# Patient Record
Sex: Female | Born: 1990 | Race: White | Hispanic: No | Marital: Single | Smoking: Never smoker
Health system: Southern US, Community
[De-identification: ages and names within clinical notes are randomized; demographics above are authoritative.]

## PROBLEM LIST (undated history)

## (undated) DIAGNOSIS — J45909 Unspecified asthma, uncomplicated: Secondary | ICD-10-CM

## (undated) DIAGNOSIS — L732 Hidradenitis suppurativa: Secondary | ICD-10-CM

## (undated) DIAGNOSIS — D649 Anemia, unspecified: Secondary | ICD-10-CM

## (undated) DIAGNOSIS — Z8719 Personal history of other diseases of the digestive system: Secondary | ICD-10-CM

## (undated) DIAGNOSIS — J3089 Other allergic rhinitis: Secondary | ICD-10-CM

## (undated) HISTORY — PX: TONSILLECTOMY: SUR1361

---

## 2014-10-01 DIAGNOSIS — L732 Hidradenitis suppurativa: Secondary | ICD-10-CM | POA: Insufficient documentation

## 2014-10-01 DIAGNOSIS — L700 Acne vulgaris: Secondary | ICD-10-CM | POA: Insufficient documentation

## 2016-01-17 ENCOUNTER — Encounter: Payer: Self-pay | Admitting: *Deleted

## 2016-01-17 ENCOUNTER — Emergency Department
Admission: EM | Admit: 2016-01-17 | Discharge: 2016-01-17 | Disposition: A | Discharge status: Home / Self Care | Payer: Self-pay | Attending: Student | Admitting: Student

## 2016-01-17 ENCOUNTER — Emergency Department: Payer: Self-pay

## 2016-01-17 DIAGNOSIS — R1013 Epigastric pain: Secondary | ICD-10-CM

## 2016-01-17 DIAGNOSIS — R55 Syncope and collapse: Secondary | ICD-10-CM | POA: Insufficient documentation

## 2016-01-17 DIAGNOSIS — R079 Chest pain, unspecified: Secondary | ICD-10-CM | POA: Insufficient documentation

## 2016-01-17 DIAGNOSIS — Z8719 Personal history of other diseases of the digestive system: Secondary | ICD-10-CM

## 2016-01-17 DIAGNOSIS — R52 Pain, unspecified: Secondary | ICD-10-CM

## 2016-01-17 DIAGNOSIS — R112 Nausea with vomiting, unspecified: Secondary | ICD-10-CM

## 2016-01-17 DIAGNOSIS — K8020 Calculus of gallbladder without cholecystitis without obstruction: Secondary | ICD-10-CM | POA: Insufficient documentation

## 2016-01-17 DIAGNOSIS — J45909 Unspecified asthma, uncomplicated: Secondary | ICD-10-CM | POA: Insufficient documentation

## 2016-01-17 HISTORY — DX: Personal history of other diseases of the digestive system: Z87.19

## 2016-01-17 HISTORY — DX: Unspecified asthma, uncomplicated: J45.909

## 2016-01-17 HISTORY — DX: Hidradenitis suppurativa: L73.2

## 2016-01-17 LAB — CBC WITH DIFFERENTIAL/PLATELET
BASOS ABS: 0 10*3/uL (ref 0–0.1)
Basophils Relative: 0 %
EOS PCT: 2 %
Eosinophils Absolute: 0.1 10*3/uL (ref 0–0.7)
HEMATOCRIT: 38.6 % (ref 35.0–47.0)
HEMOGLOBIN: 12.6 g/dL (ref 12.0–16.0)
LYMPHS ABS: 2.8 10*3/uL (ref 1.0–3.6)
Lymphocytes Relative: 34 %
MCH: 25.3 pg — AB (ref 26.0–34.0)
MCHC: 32.6 g/dL (ref 32.0–36.0)
MCV: 77.7 fL — AB (ref 80.0–100.0)
Monocytes Absolute: 0.7 10*3/uL (ref 0.2–0.9)
Monocytes Relative: 9 %
NEUTROS ABS: 4.7 10*3/uL (ref 1.4–6.5)
NEUTROS PCT: 55 %
Platelets: 275 10*3/uL (ref 150–440)
RBC: 4.97 MIL/uL (ref 3.80–5.20)
RDW: 16.2 % — ABNORMAL HIGH (ref 11.5–14.5)
WBC: 8.5 10*3/uL (ref 3.6–11.0)

## 2016-01-17 LAB — COMPREHENSIVE METABOLIC PANEL
ALK PHOS: 53 U/L (ref 38–126)
ALT: 27 U/L (ref 14–54)
AST: 19 U/L (ref 15–41)
Albumin: 3.8 g/dL (ref 3.5–5.0)
Anion gap: 7 (ref 5–15)
BUN: 11 mg/dL (ref 6–20)
CALCIUM: 8.8 mg/dL — AB (ref 8.9–10.3)
CO2: 24 mmol/L (ref 22–32)
CREATININE: 0.7 mg/dL (ref 0.44–1.00)
Chloride: 106 mmol/L (ref 101–111)
Glucose, Bld: 109 mg/dL — ABNORMAL HIGH (ref 65–99)
Potassium: 3.9 mmol/L (ref 3.5–5.1)
Sodium: 137 mmol/L (ref 135–145)
Total Bilirubin: 0.2 mg/dL — ABNORMAL LOW (ref 0.3–1.2)
Total Protein: 7.4 g/dL (ref 6.5–8.1)

## 2016-01-17 LAB — LIPASE, BLOOD: LIPASE: 24 U/L (ref 11–51)

## 2016-01-17 LAB — TROPONIN I

## 2016-01-17 LAB — FIBRIN DERIVATIVES D-DIMER (ARMC ONLY): Fibrin derivatives D-dimer (ARMC): 523 — ABNORMAL HIGH (ref 0–499)

## 2016-01-17 LAB — POCT PREGNANCY, URINE: Preg Test, Ur: NEGATIVE

## 2016-01-17 MED ORDER — ONDANSETRON 4 MG PO TBDP: 4.0000 mg | ORAL_TABLET | Freq: Three times a day (TID) | ORAL | 0 refills | Status: DC | PRN

## 2016-01-17 MED ORDER — OXYCODONE HCL 5 MG PO TABS: 5.0000 mg | ORAL_TABLET | Freq: Four times a day (QID) | ORAL | 0 refills | Status: DC | PRN

## 2016-01-17 MED ORDER — MORPHINE SULFATE (PF) 4 MG/ML IV SOLN
INTRAVENOUS | Status: AC
  Administered 2016-01-17: 4 mg via INTRAVENOUS
  Filled 2016-01-17: qty 1

## 2016-01-17 MED ORDER — MORPHINE SULFATE (PF) 4 MG/ML IV SOLN
4.0000 mg | Freq: Once | INTRAVENOUS | Status: AC
  Administered 2016-01-17: 4 mg via INTRAVENOUS

## 2016-01-17 MED ORDER — ONDANSETRON HCL 4 MG/2ML IJ SOLN
4.0000 mg | Freq: Once | INTRAMUSCULAR | Status: AC
  Administered 2016-01-17: 4 mg via INTRAVENOUS
  Filled 2016-01-17: qty 2

## 2016-01-17 MED ORDER — IOPAMIDOL (ISOVUE-370) INJECTION 76%
100.0000 mL | Freq: Once | INTRAVENOUS | Status: AC | PRN
  Administered 2016-01-17: 100 mL via INTRAVENOUS

## 2016-01-17 MED ORDER — SODIUM CHLORIDE 0.9 % IV BOLUS (SEPSIS)
1000.0000 mL | Freq: Once | INTRAVENOUS | Status: AC
  Administered 2016-01-17: 1000 mL via INTRAVENOUS

## 2016-01-17 MED ORDER — MORPHINE SULFATE (PF) 4 MG/ML IV SOLN
4.0000 mg | Freq: Once | INTRAVENOUS | Status: AC
  Administered 2016-01-17: 4 mg via INTRAVENOUS
  Filled 2016-01-17: qty 1

## 2016-01-17 NOTE — ED Provider Notes (Signed)
[facility] Emergency Department Provider Note   ____________________________________________   First MD Initiated Contact with Patient 01/17/16 [phone]     (approximate)  I have reviewed the triage vital signs and the nursing notes.   HISTORY  Chief Complaint Abdominal Pain    HPI Diana Aguirre is a 25 y.o. female with history of hidradenitis suppurativa and asthma who presents for evaluation of severe epigastric pain radiating to her back which awoke her from sleep earlier this morning, sudden onset, constant, no modifying factors are she is also had several episodes of nonbloody nonbilious emesis. She has never had anything like this before. She denies any chest pain or difficulty breathing. She reports the pain in her abdomen is severe and radiates to her back. No pain or burning with urination. She recently fractured her left foot and is in a walking boot. She does take oral contraceptives continuously and has not had a menatrual period since June. In the waiting room she became diaphoretic, was complaining of pain, had lightheadedness and a near syncopal episode where she did not fall or injure herself.   Past Medical History:  Diagnosis Date  . Asthma   . Hidradenitis     There are no active problems to display for this patient.   History reviewed. No pertinent surgical history.  Prior to Admission medications   Not on File    Allergies Latex and Sulfa antibiotics  History reviewed. No pertinent family history.  Social History Social History  Substance Use Topics  . Smoking status: Never Smoker  . Smokeless tobacco: Never Used  . Alcohol use Not on file    Review of Systems Constitutional: No fever/chills Eyes: No visual changes. ENT: No sore throat. Cardiovascular: Denies chest pain. Respiratory: Denies shortness of breath. Gastrointestinal: + abdominal pain.  + nausea, + vomiting.  No diarrhea.  No constipation. Genitourinary:  Negative for dysuria. Musculoskeletal: Negative for back pain. Skin: Negative for rash. Neurological: Negative for headaches, focal weakness or numbness.  10-point ROS otherwise negative.  ____________________________________________   PHYSICAL EXAM:  VITAL SIGNS: ED Triage Vitals [01/17/16 0707]  Enc Vitals Group     BP (!) 156/96     Pulse Rate (!) 107     Resp 18     Temp 98.6 F (37 C)     Temp Source Oral     SpO2 98 %     Weight 275 lb (124.7 kg)     Height 5\' 7"  (1.702 m)     Head Circumference      Peak Flow      Pain Score 9     Pain Loc      Pain Edu?      Excl. in GC?     Constitutional: Alert and oriented. Nontoxic-appearing, in mild distress due to pain. Eyes: Conjunctivae are normal. PERRL. EOMI. Head: Atraumatic. Nose: No congestion/rhinnorhea. Mouth/Throat: Mucous membranes are moist.  Oropharynx non-erythematous. Neck: No stridor.  Supple without meningismus. Cardiovascular: Mildly tachycardic rate, regular rhythm. Grossly normal heart sounds.  Good peripheral circulation. Respiratory: Normal respiratory effort.  No retractions. Lungs CTAB. Gastrointestinal: Soft with tenderness to palpation in the epigastrium and the right upper quadrant. No CVA tenderness. Genitourinary: Deferred Musculoskeletal: Left foot in the walking boot, brisk capillary refill. Neurologic:  Normal speech and language. No gross focal neurologic deficits are appreciated. No gait instability. Skin:  Skin is warm, dry and intact. No rash noted. Psychiatric: Mood and affect are normal. Speech and behavior  are normal.  ____________________________________________   LABS (all labs ordered are listed, but only abnormal results are displayed)  Labs Reviewed  CBC WITH DIFFERENTIAL/PLATELET - Abnormal; Notable for the following:       Result Value   MCV 77.7 (*)    MCH 25.3 (*)    RDW 16.2 (*)    All other components within normal limits  COMPREHENSIVE METABOLIC PANEL -  Abnormal; Notable for the following:    Glucose, Bld 109 (*)    Calcium 8.8 (*)    Total Bilirubin 0.2 (*)    All other components within normal limits  FIBRIN DERIVATIVES D-DIMER ([facility] ONLY) - Abnormal; Notable for the following:    Fibrin derivatives D-dimer ([facility]) 523 (*)    All other components within normal limits  LIPASE, BLOOD  TROPONIN I  POCT PREGNANCY, URINE   ____________________________________________  EKG  ED ECG REPORT I, Gayla Doss, the attending physician, personally viewed and interpreted this ECG.   Date: 01/17/2016  EKG Time: 07:26  Rate: 89  Rhythm: normal EKG, normal sinus rhythm  Axis: normal  Intervals:none  ST&T Change: No acute ST elevation or acute ST depression. Normal QTC, no STEMI, no Brugada.  ____________________________________________  RADIOLOGY  CXR IMPRESSION:  No active disease.     RUQ ultrasound   IMPRESSION:  Non mobile gallstone in gallbladder neck region or within cystic  duct measures 2.3 cm. No sonographic Murphy's sign. Mild prominent  size CBD measures 7 mm in diameter. No focal hepatic mass. Diffuse  increased echogenicity of the liver suspicious for fatty  infiltration.     CTA chest   IMPRESSION:  1. No pulmonary embolus is noted.  2. No mediastinal hematoma or adenopathy.  3. Small hiatal hernia. No acute infiltrate or pulmonary edema.  4. Gallstones are noted within gallbladder.       ____________________________________________   PROCEDURES  Procedure(s) performed: None  Procedures  Critical Care performed: No  ____________________________________________   INITIAL IMPRESSION / ASSESSMENT AND PLAN / ED COURSE  Pertinent labs & imaging results that were available during my care of the patient were reviewed by me and considered in my medical decision making (see chart for details).  Diana Aguirre is a 25 y.o. female with history of hidradenitis suppurativa and asthma who presents  for evaluation of severe epigastric pain radiating to her back which awoke her from sleep earlier this morning. On exam she appears to be uncomfortable due to pain. She is mildly  tachycardic but remainder of her vital signs are stable and she is afebrile. She does have tenderness to palpation in the epigastrium and the right upper quadrant. We'll obtain right upper quadrant ultrasound as well as screening abdominal labs to evaluate for acute gallbladder pathology. We'll also send a d-dimer as she is at high risk for an atypical PE given oral contraceptives as well as recent foot fracture. We'll treat her symptomatically with pain medicines, IV fluids, and antiemetics and reassess for disposition and need for additional imaging. I suspect she had a near syncopal episode which was vasovagal in nature and secondary to pain and given significant prodrome.  ----------------------------------------- 11:55 AM on 01/17/2016 ----------------------------------------- Patient reports she feels much better at this time, she appears comfortable, drinking water and eating saltine crackers. She is not vomiting. CBC unremarkable, CMP also generally unremarkable as is lipase. Negative pregnancy test, urinalysis cancelled given no urinary complaints. Troponin negative. D-dimer was elevated at 523, CTA chest for PE. Right upper quadrant ultrasound  shows non-mobile gallstones in the gallbladder neck, CBD 7 mm, no evidence of cholecystitis. I discussed the case with Dr. Tonita Cong of general surgery who agrees that as her pain is very well controlled she is tolerating by mouth intake, no fevers, she can be discharged with close outpatient surgical follow-up. I discussed return precautions with the patient she is comfortable with the discharge plan. DC home.   Clinical Course     ____________________________________________   FINAL CLINICAL IMPRESSION(S) / ED DIAGNOSES  Final diagnoses:  Pain  Epigastric pain    Non-intractable vomiting with nausea, vomiting of unspecified type  Syncope, unspecified syncope type  Symptomatic cholelithiasis      NEW MEDICATIONS STARTED DURING THIS VISIT:  New Prescriptions   No medications on file     Note:  This document was prepared using Dragon voice recognition software and may include unintentional dictation errors.    Gayla Doss, MD 01/17/16 1158

## 2016-01-17 NOTE — ED Triage Notes (Signed)
Pt states she woke up this AM with upper abd pain shooting through to her back, states 1 episode of vomiting this AM, pt states she took a pain pill this AM with no relief, pt tearful

## 2016-01-20 ENCOUNTER — Other Ambulatory Visit: Payer: Self-pay

## 2016-01-21 ENCOUNTER — Encounter: Payer: Self-pay | Admitting: Surgery

## 2016-01-21 ENCOUNTER — Ambulatory Visit (INDEPENDENT_AMBULATORY_CARE_PROVIDER_SITE_OTHER): Payer: BLUE CROSS/BLUE SHIELD | Admitting: Surgery

## 2016-01-21 VITALS — BP 135/79 | HR 82 | Temp 98.3°F | Ht 67.0 in | Wt 311.0 lb

## 2016-01-21 DIAGNOSIS — K8020 Calculus of gallbladder without cholecystitis without obstruction: Secondary | ICD-10-CM | POA: Diagnosis not present

## 2016-01-21 NOTE — Progress Notes (Signed)
Patient ID: Diana Aguirre, female   DOB: 07/18/1990, 25 y.o.   MRN: [id number]  HPI Diana Aguirre is a 25 y.o. female referred by Dr. Claris GladdenGale and seen in consultation for symptomatic cholelithiasis. Patient reports that 4 days ago she started having severe right upper quadrant pain that radiated to her back. Pain was sharp moderate to severe in intensity. She did go to the emergency room were IV fluids and IV narcotics were administered with improvement of her symptoms. However since then she has had some intermittent pain that actually was exacerbated by fatty meals. There is no evidence of biliary obstruction. I have personally reviewed the ultrasound showing evidence of large stone with a common bile duct measuring 7 mm. Her lipase and CMP was completely normal as well as her CBC. She denies any fevers chills. She does have arteries. Nausea and decreased appetite. She does have a history of anxiety as well as hidradenitis.  HPI  Past Medical History:  Diagnosis Date  . Asthma   . Hidradenitis     Past Surgical History:  Procedure Laterality Date  . TONSILLECTOMY     1999    Family History  Problem Relation Age of Onset  . Hypertension Mother   . Heart disease Maternal Grandfather     Social History Social History  Substance Use Topics  . Smoking status: Never Smoker  . Smokeless tobacco: Never Used  . Alcohol use No    Allergies  Allergen Reactions  . 2,4-D Dimethylamine (Amisol) Swelling and Other (See Comments)    Pt reports throat swells  . Amoxicillin-Pot Clavulanate Other (See Comments)  . Sulfa Antibiotics   . Latex Rash    Current Outpatient Prescriptions  Medication Sig Dispense Refill  . buPROPion (WELLBUTRIN XL) 150 MG 24 hr tablet Take by mouth.    . drospirenone-ethinyl estradiol (YAZ,GIANVI,LORYNA) 3-0.02 MG tablet Take by mouth.    . escitalopram (LEXAPRO) 20 MG tablet Take by mouth.    . oxyCODONE (ROXICODONE) 5 MG immediate release tablet Take 1 tablet (5  mg total) by mouth every 6 (six) hours as needed for moderate pain. Do not drive while taking this medication. 15 tablet 0  . spironolactone (ALDACTONE) 100 MG tablet Take 100 mg by mouth.    . ondansetron (ZOFRAN ODT) 4 MG disintegrating tablet Take 1 tablet (4 mg total) by mouth every 8 (eight) hours as needed for nausea or vomiting. (Patient not taking: Reported on 01/21/2016) 12 tablet 0   No current facility-administered medications for this visit.      Review of Systems A 10 point review of systems was asked and was negative except for the information on the HPI  Physical Exam Blood pressure 135/79, pulse 82, temperature 98.3 F (36.8 C), temperature source Oral, height 5\' 7"  (1.702 m), weight (!) 141.1 kg (311 lb), last menstrual period 10/19/2015. CONSTITUTIONAL: Very nice morbidly obese female in no acute distress EYES: Pupils are equal, round, and reactive to light, Sclera are non-icteric. EARS, NOSE, MOUTH AND THROAT: The oropharynx is clear. The oral mucosa is pink and moist. Hearing is intact to voice. LYMPH NODES:  Lymph nodes in the neck are normal. RESPIRATORY:  Lungs are clear. There is normal respiratory effort, with equal breath sounds bilaterally, and without pathologic use of accessory muscles. CARDIOVASCULAR: Heart is regular without murmurs, gallops, or rubs. GI: The abdomen is soft, mildly tender to palpation in the right upper quadrant GU: Rectal deferred.   MUSCULOSKELETAL: Normal muscle strength and tone. No  cyanosis or edema.   SKIN: Turgor is good and there are no pathologic skin lesions or ulcers. NEUROLOGIC: Motor and sensation is grossly normal. Cranial nerves are grossly intact. PSYCH:  Oriented to person, place and time. Affect is normal.  Data Reviewed I have personally reviewed the patient's imaging, laboratory findings and medical records.    Assessment Plan Symptomatic cholelithiasis, discussed with the patient in detail about her disease process. I  do recommend laparoscopic cholecystectomy with intraoperative cholangiogram given the fact that there is a slight increase in her common bile duct. This is likely results for some inflammatory process and not necessarily being obstruction. More importantly clinically there is no evidence of biliary obstruction and there is really no laboratory evidence of obstruction either. I discussed the procedure in detail.  The patient was given Agricultural engineer.  We discussed the risks and benefits of a laparoscopic cholecystectomy and possible cholangiogram including, but not limited to bleeding, infection, injury to surrounding structures such as the intestine or liver, bile leak, retained gallstones, need to convert to an open procedure, prolonged diarrhea, blood clots such as  DVT, common bile duct injury, anesthesia risks, and possible need for additional procedures.  The likelihood of improvement in symptoms and return to the patient's normal status is good. We discussed the typical post-operative recovery course I specifically discussed with her that given her morbid obesity there might be complications related to her airway and anesthesia complications. I also discussed with her that on the ultrasound I see a fatty liver . Weight Reduction definitely recommended. Extensive counseling provided, all questions were answered  Sterling Big, MD FACS General Surgeon 01/21/2016, 11:52 AM

## 2016-01-21 NOTE — Patient Instructions (Signed)
Please refer to the blue pre care sheet that was given to you for information regarding your surgery. Please call our office if you have questions or concerns.

## 2016-01-22 ENCOUNTER — Telehealth: Payer: Self-pay | Admitting: Surgery

## 2016-01-22 NOTE — Telephone Encounter (Signed)
Pt advised of pre op date/time and sx date. Sx: 02/02/16 with Dr Pabon--laparoscopic cholecystectomy with IOC.  Pre op: 01/26/16 between 1-5:00pm--phone.   Patient made aware to call [phone], between 1-3:00pm the day before surgery, to find out what time to arrive.

## 2016-01-26 ENCOUNTER — Inpatient Hospital Stay: Admission: RE | Admit: 2016-01-26 | Payer: Self-pay | Source: Ambulatory Visit

## 2016-01-26 HISTORY — DX: Personal history of other diseases of the digestive system: Z87.19

## 2016-01-27 ENCOUNTER — Encounter
Admission: RE | Admit: 2016-01-27 | Discharge: 2016-01-27 | Disposition: A | Discharge status: Home / Self Care | Payer: Self-pay | Source: Ambulatory Visit | Attending: Surgery | Admitting: Surgery

## 2016-01-27 HISTORY — DX: Anemia, unspecified: D64.9

## 2016-01-27 NOTE — Pre-Procedure Instructions (Signed)
SPOKE WITH DR Henrene HawkingKEPHART REGARDING DR PABON'S ORDER FOR ANESTHESIA CONSULT REGARDING MORBID OBESITY AND EVALUATION OF AIRWAY-DR KEPHART SAID IT IS OK TO DO THIS DAY OF SURGERY

## 2016-01-27 NOTE — Patient Instructions (Signed)
  Your procedure is scheduled on: 02-02-16 Report to Same Day Surgery 2nd floor medical mall To find out your arrival time please call [phone] between 1PM - 3PM on 02-01-16  Remember: Instructions that are not followed completely may result in serious medical risk, up to and including death, or upon the discretion of your surgeon and anesthesiologist your surgery may need to be rescheduled.    _x___ 1. Do not eat food or drink liquids after midnight. No gum chewing or hard candies.     __x__ 2. No Alcohol for 24 hours before or after surgery.   __x__3. No Smoking for 24 prior to surgery.   ____  4. Bring all medications with you on the day of surgery if instructed.    __x__ 5. Notify your doctor if there is any change in your medical condition     (cold, fever, infections).     Do not wear jewelry, make-up, hairpins, clips or nail polish.  Do not wear lotions, powders, or perfumes. You may wear deodorant.  Do not shave 48 hours prior to surgery. Men may shave face and neck.  Do not bring valuables to the hospital.    [facility] is not responsible for any belongings or valuables.               Contacts, dentures or bridgework may not be worn into surgery.  Leave your suitcase in the car. After surgery it may be brought to your room.  For patients admitted to the hospital, discharge time is determined by your treatment team.   Patients discharged the day of surgery will not be allowed to drive home.    Please read over the following fact sheets that you were given:   [facility] Preparing for Surgery and or MRSA Information   ____ Take these medicines the morning of surgery with A SIP OF WATER:    1. NONE  2.  3.  4.  5.  6.  ____Fleets enema or Magnesium Citrate as directed.   ____ Use CHG Soap or sage wipes as directed on instruction sheet   _X___ Use inhalers on the day of surgery and bring to hospital day of surgery-USE ALBUTEROL INHALER AND BRING TO  HOSPITAL  ____ Stop metformin 2 days prior to surgery    ____ Take 1/2 of usual insulin dose the night before surgery and none on the morning of surgery.   ____ Stop aspirin or coumadin, or plavix  x__ Stop Anti-inflammatories such as Advil, Aleve, Ibuprofen, Motrin, Naproxen,          Naprosyn, Goodies powders or aspirin products. Ok to take Tylenol.   ____ Stop supplements until after surgery.    ____ Bring C-Pap to the hospital.

## 2016-02-02 ENCOUNTER — Ambulatory Visit: Payer: BLUE CROSS/BLUE SHIELD

## 2016-02-02 ENCOUNTER — Encounter: Payer: Self-pay | Admitting: *Deleted

## 2016-02-02 ENCOUNTER — Ambulatory Visit: Payer: BLUE CROSS/BLUE SHIELD | Admitting: Anesthesiology

## 2016-02-02 ENCOUNTER — Encounter
Admission: RE | Disposition: A | Discharge status: Home / Self Care | Payer: Self-pay | Source: Ambulatory Visit | Attending: Surgery

## 2016-02-02 ENCOUNTER — Ambulatory Visit
Admission: RE | Admit: 2016-02-02 | Discharge: 2016-02-02 | Disposition: A | Discharge status: Home / Self Care | Payer: BLUE CROSS/BLUE SHIELD | Source: Ambulatory Visit | Attending: Surgery | Admitting: Surgery

## 2016-02-02 DIAGNOSIS — J45909 Unspecified asthma, uncomplicated: Secondary | ICD-10-CM | POA: Diagnosis not present

## 2016-02-02 DIAGNOSIS — K449 Diaphragmatic hernia without obstruction or gangrene: Secondary | ICD-10-CM | POA: Diagnosis not present

## 2016-02-02 DIAGNOSIS — Z8249 Family history of ischemic heart disease and other diseases of the circulatory system: Secondary | ICD-10-CM | POA: Insufficient documentation

## 2016-02-02 DIAGNOSIS — Z91040 Latex allergy status: Secondary | ICD-10-CM | POA: Insufficient documentation

## 2016-02-02 DIAGNOSIS — K8010 Calculus of gallbladder with chronic cholecystitis without obstruction: Secondary | ICD-10-CM | POA: Diagnosis not present

## 2016-02-02 DIAGNOSIS — Z881 Allergy status to other antibiotic agents status: Secondary | ICD-10-CM | POA: Insufficient documentation

## 2016-02-02 DIAGNOSIS — Z79899 Other long term (current) drug therapy: Secondary | ICD-10-CM | POA: Insufficient documentation

## 2016-02-02 DIAGNOSIS — L732 Hidradenitis suppurativa: Secondary | ICD-10-CM | POA: Diagnosis not present

## 2016-02-02 DIAGNOSIS — Z888 Allergy status to other drugs, medicaments and biological substances status: Secondary | ICD-10-CM | POA: Insufficient documentation

## 2016-02-02 DIAGNOSIS — Z882 Allergy status to sulfonamides status: Secondary | ICD-10-CM | POA: Insufficient documentation

## 2016-02-02 DIAGNOSIS — Z419 Encounter for procedure for purposes other than remedying health state, unspecified: Secondary | ICD-10-CM

## 2016-02-02 DIAGNOSIS — D649 Anemia, unspecified: Secondary | ICD-10-CM | POA: Diagnosis not present

## 2016-02-02 HISTORY — PX: CHOLECYSTECTOMY: SHX55

## 2016-02-02 LAB — POCT PREGNANCY, URINE: PREG TEST UR: NEGATIVE

## 2016-02-02 SURGERY — LAPAROSCOPIC CHOLECYSTECTOMY
Anesthesia: General

## 2016-02-02 MED ORDER — GABAPENTIN 300 MG PO CAPS
300.0000 mg | ORAL_CAPSULE | ORAL | Status: AC
  Administered 2016-02-02: 300 mg via ORAL

## 2016-02-02 MED ORDER — KETOROLAC TROMETHAMINE 30 MG/ML IJ SOLN
INTRAMUSCULAR | Status: DC | PRN
  Administered 2016-02-02: 30 mg via INTRAVENOUS

## 2016-02-02 MED ORDER — ROCURONIUM BROMIDE 100 MG/10ML IV SOLN: INTRAVENOUS | Status: DC | PRN

## 2016-02-02 MED ORDER — HYDROMORPHONE HCL 1 MG/ML IJ SOLN
0.5000 mg | INTRAMUSCULAR | Status: DC | PRN
  Administered 2016-02-02 (×3): 0.5 mg via INTRAVENOUS

## 2016-02-02 MED ORDER — SUCCINYLCHOLINE CHLORIDE 20 MG/ML IJ SOLN
INTRAMUSCULAR | Status: DC | PRN
  Administered 2016-02-02: 120 mg via INTRAVENOUS

## 2016-02-02 MED ORDER — FAMOTIDINE 20 MG PO TABS
ORAL_TABLET | ORAL | Status: AC
  Filled 2016-02-02: qty 1

## 2016-02-02 MED ORDER — ACETAMINOPHEN 10 MG/ML IV SOLN
INTRAVENOUS | Status: AC
  Filled 2016-02-02: qty 100

## 2016-02-02 MED ORDER — OXYCODONE-ACETAMINOPHEN 7.5-325 MG PO TABS
1.0000 | ORAL_TABLET | ORAL | Status: DC | PRN
  Administered 2016-02-02: 1 via ORAL

## 2016-02-02 MED ORDER — ACETAMINOPHEN 500 MG PO TABS
ORAL_TABLET | ORAL | Status: AC
  Filled 2016-02-02: qty 2

## 2016-02-02 MED ORDER — ONDANSETRON HCL 4 MG/2ML IJ SOLN: 4.0000 mg | Freq: Once | INTRAMUSCULAR | Status: DC | PRN

## 2016-02-02 MED ORDER — GLYCOPYRROLATE 0.2 MG/ML IJ SOLN
INTRAMUSCULAR | Status: DC | PRN
  Administered 2016-02-02: 0.2 mg via INTRAVENOUS

## 2016-02-02 MED ORDER — CHLORHEXIDINE GLUCONATE CLOTH 2 % EX PADS: 6.0000 | MEDICATED_PAD | Freq: Once | CUTANEOUS | Status: DC

## 2016-02-02 MED ORDER — HYDROMORPHONE HCL 1 MG/ML IJ SOLN: 0.5000 mg | Freq: Once | INTRAMUSCULAR | Status: DC

## 2016-02-02 MED ORDER — LIDOCAINE HCL (CARDIAC) 20 MG/ML IV SOLN
INTRAVENOUS | Status: DC | PRN
  Administered 2016-02-02: 100 mg via INTRAVENOUS

## 2016-02-02 MED ORDER — FENTANYL CITRATE (PF) 100 MCG/2ML IJ SOLN
INTRAMUSCULAR | Status: DC | PRN
  Administered 2016-02-02: 100 ug via INTRAVENOUS
  Administered 2016-02-02 (×2): 50 ug via INTRAVENOUS
  Administered 2016-02-02: 100 ug via INTRAVENOUS

## 2016-02-02 MED ORDER — FENTANYL CITRATE (PF) 100 MCG/2ML IJ SOLN
INTRAMUSCULAR | Status: AC
  Administered 2016-02-02: 25 ug via INTRAVENOUS
  Filled 2016-02-02: qty 2

## 2016-02-02 MED ORDER — ONDANSETRON HCL 4 MG/2ML IJ SOLN
INTRAMUSCULAR | Status: DC | PRN
  Administered 2016-02-02: 4 mg via INTRAVENOUS

## 2016-02-02 MED ORDER — BUPIVACAINE-EPINEPHRINE 0.25% -1:200000 IJ SOLN
INTRAMUSCULAR | Status: DC | PRN
  Administered 2016-02-02: 30 mL

## 2016-02-02 MED ORDER — ROCURONIUM BROMIDE 100 MG/10ML IV SOLN
INTRAVENOUS | Status: DC | PRN
  Administered 2016-02-02 (×2): 10 mg via INTRAVENOUS
  Administered 2016-02-02: 40 mg via INTRAVENOUS

## 2016-02-02 MED ORDER — OXYCODONE-ACETAMINOPHEN 7.5-325 MG PO TABS: 2.0000 | ORAL_TABLET | ORAL | 0 refills | Status: DC | PRN

## 2016-02-02 MED ORDER — LACTATED RINGERS IV SOLN
INTRAVENOUS | Status: DC
  Administered 2016-02-02 (×2): via INTRAVENOUS

## 2016-02-02 MED ORDER — HYDROMORPHONE HCL 1 MG/ML IJ SOLN
0.5000 mg | INTRAMUSCULAR | Status: DC | PRN
  Administered 2016-02-02: 0.5 mg via INTRAVENOUS

## 2016-02-02 MED ORDER — HYDROMORPHONE HCL 1 MG/ML IJ SOLN
INTRAMUSCULAR | Status: AC
  Administered 2016-02-02: 0.5 mg via INTRAVENOUS
  Filled 2016-02-02: qty 1

## 2016-02-02 MED ORDER — ACETAMINOPHEN 10 MG/ML IV SOLN
INTRAVENOUS | Status: DC | PRN
  Administered 2016-02-02: 1000 mg via INTRAVENOUS

## 2016-02-02 MED ORDER — MIDAZOLAM HCL 2 MG/2ML IJ SOLN
INTRAMUSCULAR | Status: DC | PRN
  Administered 2016-02-02: 1 mg via INTRAVENOUS

## 2016-02-02 MED ORDER — GABAPENTIN 300 MG PO CAPS
ORAL_CAPSULE | ORAL | Status: AC
  Filled 2016-02-02: qty 1

## 2016-02-02 MED ORDER — CHLORHEXIDINE GLUCONATE CLOTH 2 % EX PADS
6.0000 | MEDICATED_PAD | Freq: Once | CUTANEOUS | Status: AC
  Administered 2016-02-02: 6 via TOPICAL

## 2016-02-02 MED ORDER — FENTANYL CITRATE (PF) 100 MCG/2ML IJ SOLN
25.0000 ug | INTRAMUSCULAR | Status: DC | PRN
  Administered 2016-02-02 (×4): 25 ug via INTRAVENOUS

## 2016-02-02 MED ORDER — OXYCODONE-ACETAMINOPHEN 7.5-325 MG PO TABS
ORAL_TABLET | ORAL | Status: AC
  Filled 2016-02-02: qty 1

## 2016-02-02 MED ORDER — PROPOFOL 10 MG/ML IV BOLUS
INTRAVENOUS | Status: DC | PRN
  Administered 2016-02-02: 200 mg via INTRAVENOUS

## 2016-02-02 MED ORDER — ACETAMINOPHEN 500 MG PO TABS
1000.0000 mg | ORAL_TABLET | ORAL | Status: AC
  Administered 2016-02-02: 1000 mg via ORAL

## 2016-02-02 MED ORDER — CLINDAMYCIN PHOSPHATE 900 MG/50ML IV SOLN
900.0000 mg | Freq: Once | INTRAVENOUS | Status: AC
  Administered 2016-02-02: 900 mg via INTRAVENOUS

## 2016-02-02 MED ORDER — SODIUM CHLORIDE 0.9 % IJ SOLN
INTRAMUSCULAR | Status: AC
  Filled 2016-02-02: qty 50

## 2016-02-02 MED ORDER — BUPIVACAINE-EPINEPHRINE (PF) 0.25% -1:200000 IJ SOLN
INTRAMUSCULAR | Status: AC
  Filled 2016-02-02: qty 30

## 2016-02-02 MED ORDER — CLINDAMYCIN PHOSPHATE 900 MG/50ML IV SOLN
INTRAVENOUS | Status: AC
  Filled 2016-02-02: qty 50

## 2016-02-02 MED ORDER — FAMOTIDINE 20 MG PO TABS
20.0000 mg | ORAL_TABLET | Freq: Once | ORAL | Status: AC
  Administered 2016-02-02: 20 mg via ORAL

## 2016-02-02 MED ORDER — DEXAMETHASONE SODIUM PHOSPHATE 10 MG/ML IJ SOLN
INTRAMUSCULAR | Status: DC | PRN
  Administered 2016-02-02: 10 mg via INTRAVENOUS

## 2016-02-02 SURGICAL SUPPLY — 45 items
APPLICATOR COTTON TIP 6IN STRL (MISCELLANEOUS) ×2 IMPLANT
APPLIER CLIP 5 13 M/L LIGAMAX5 (MISCELLANEOUS) ×2
BLADE SURG 15 STRL LF DISP TIS (BLADE) ×1 IMPLANT
BLADE SURG 15 STRL SS (BLADE) ×1
CANISTER SUCT 1200ML W/VALVE (MISCELLANEOUS) ×2 IMPLANT
CHLORAPREP W/TINT 26ML (MISCELLANEOUS) ×2 IMPLANT
CHOLANGIOGRAM CATH TAUT (CATHETERS) ×2 IMPLANT
CLEANER CAUTERY TIP 5X5 PAD (MISCELLANEOUS) IMPLANT
CLIP APPLIE 5 13 M/L LIGAMAX5 (MISCELLANEOUS) ×1 IMPLANT
DECANTER SPIKE VIAL GLASS SM (MISCELLANEOUS) IMPLANT
DEVICE TROCAR PUNCTURE CLOSURE (ENDOMECHANICALS) IMPLANT
DRAPE C-ARM XRAY 36X54 (DRAPES) ×2 IMPLANT
ELECT REM PT RETURN 9FT ADLT (ELECTROSURGICAL) ×2
ELECTRODE REM PT RTRN 9FT ADLT (ELECTROSURGICAL) ×1 IMPLANT
ENDOPOUCH RETRIEVER 10 (MISCELLANEOUS) ×2 IMPLANT
GLOVE BIO SURGEON STRL SZ7 (GLOVE) ×10 IMPLANT
GOWN STRL REUS W/ TWL LRG LVL3 (GOWN DISPOSABLE) ×3 IMPLANT
GOWN STRL REUS W/TWL LRG LVL3 (GOWN DISPOSABLE) ×3
IRRIGATION STRYKERFLOW (MISCELLANEOUS) ×1 IMPLANT
IRRIGATOR STRYKERFLOW (MISCELLANEOUS) ×2
IV CATH ANGIO 12GX3 LT BLUE (NEEDLE) ×2 IMPLANT
IV NS 1000ML (IV SOLUTION) ×1
IV NS 1000ML BAXH (IV SOLUTION) ×1 IMPLANT
L-HOOK LAP DISP 36CM (ELECTROSURGICAL) ×2
LHOOK LAP DISP 36CM (ELECTROSURGICAL) ×1 IMPLANT
LIQUID BAND (GAUZE/BANDAGES/DRESSINGS) ×2 IMPLANT
NEEDLE HYPO 22GX1.5 SAFETY (NEEDLE) ×2 IMPLANT
PACK LAP CHOLECYSTECTOMY (MISCELLANEOUS) ×2 IMPLANT
PAD CLEANER CAUTERY TIP 5X5 (MISCELLANEOUS)
PENCIL ELECTRO HAND CTR (MISCELLANEOUS) ×2 IMPLANT
SCISSORS METZENBAUM CVD 33 (INSTRUMENTS) ×2 IMPLANT
SLEEVE ENDOPATH XCEL 5M (ENDOMECHANICALS) ×4 IMPLANT
SOL ANTI-FOG 6CC FOG-OUT (MISCELLANEOUS) ×1 IMPLANT
SOL FOG-OUT ANTI-FOG 6CC (MISCELLANEOUS) ×1
SPONGE LAP 18X18 5 PK (GAUZE/BANDAGES/DRESSINGS) ×2 IMPLANT
STOPCOCK 3 WAY  REPLAC (MISCELLANEOUS) IMPLANT
SUT ETHIBOND 0 MO6 C/R (SUTURE) IMPLANT
SUT MNCRL AB 4-0 PS2 18 (SUTURE) ×2 IMPLANT
SUT VIC AB 0 CT2 27 (SUTURE) IMPLANT
SUT VICRYL 0 AB UR-6 (SUTURE) ×4 IMPLANT
SYR 20CC LL (SYRINGE) ×2 IMPLANT
TROCAR XCEL BLUNT TIP 100MML (ENDOMECHANICALS) ×2 IMPLANT
TROCAR XCEL NON-BLD 5MMX100MML (ENDOMECHANICALS) ×2 IMPLANT
TUBING INSUFFLATOR HI FLOW (MISCELLANEOUS) ×2 IMPLANT
WATER STERILE IRR 1000ML POUR (IV SOLUTION) ×2 IMPLANT

## 2016-02-02 NOTE — Anesthesia Postprocedure Evaluation (Signed)
Anesthesia Post Note  Patient: Diana HighSarah Aguirre  Procedure(s) Performed: Procedure(s): LAPAROSCOPIC CHOLECYSTECTOMY  Patient location during evaluation: PACU Anesthesia Type: General Level of consciousness: awake Pain management: pain level controlled Vital Signs Assessment: post-procedure vital signs reviewed and stable Respiratory status: spontaneous breathing Cardiovascular status: stable Anesthetic complications: no    Last Vitals:  Vitals:   02/02/16 1045 02/02/16 1404  BP: (!) 134/93 135/80  Pulse: (!) 109 (!) 103  Resp: 20 (!) 25  Temp: 37.3 C (!) 36.1 C    Last Pain:  Vitals:   02/02/16 1404  TempSrc:   PainSc: Asleep                 VAN STAVEREN,Naseem Adler

## 2016-02-02 NOTE — Addendum Note (Signed)
Addendum  created 02/02/16 1707 by Junious SilkMark Jeancarlo Leffler, CRNA   Anesthesia Intra Meds edited

## 2016-02-02 NOTE — H&P (View-Only) (Signed)
Patient ID: Diana Aguirre, female   DOB: 07/18/1990, 25 y.o.   MRN: [id number]  HPI Diana Aguirre is a 25 y.o. female referred by Dr. Claris GladdenGale and seen in consultation for symptomatic cholelithiasis. Patient reports that 4 days ago she started having severe right upper quadrant pain that radiated to her back. Pain was sharp moderate to severe in intensity. She did go to the emergency room were IV fluids and IV narcotics were administered with improvement of her symptoms. However since then she has had some intermittent pain that actually was exacerbated by fatty meals. There is no evidence of biliary obstruction. I have personally reviewed the ultrasound showing evidence of large stone with a common bile duct measuring 7 mm. Her lipase and CMP was completely normal as well as her CBC. She denies any fevers chills. She does have arteries. Nausea and decreased appetite. She does have a history of anxiety as well as hidradenitis.  HPI  Past Medical History:  Diagnosis Date  . Asthma   . Hidradenitis     Past Surgical History:  Procedure Laterality Date  . TONSILLECTOMY     1999    Family History  Problem Relation Age of Onset  . Hypertension Mother   . Heart disease Maternal Grandfather     Social History Social History  Substance Use Topics  . Smoking status: Never Smoker  . Smokeless tobacco: Never Used  . Alcohol use No    Allergies  Allergen Reactions  . 2,4-D Dimethylamine (Amisol) Swelling and Other (See Comments)    Pt reports throat swells  . Amoxicillin-Pot Clavulanate Other (See Comments)  . Sulfa Antibiotics   . Latex Rash    Current Outpatient Prescriptions  Medication Sig Dispense Refill  . buPROPion (WELLBUTRIN XL) 150 MG 24 hr tablet Take by mouth.    . drospirenone-ethinyl estradiol (YAZ,GIANVI,LORYNA) 3-0.02 MG tablet Take by mouth.    . escitalopram (LEXAPRO) 20 MG tablet Take by mouth.    . oxyCODONE (ROXICODONE) 5 MG immediate release tablet Take 1 tablet (5  mg total) by mouth every 6 (six) hours as needed for moderate pain. Do not drive while taking this medication. 15 tablet 0  . spironolactone (ALDACTONE) 100 MG tablet Take 100 mg by mouth.    . ondansetron (ZOFRAN ODT) 4 MG disintegrating tablet Take 1 tablet (4 mg total) by mouth every 8 (eight) hours as needed for nausea or vomiting. (Patient not taking: Reported on 01/21/2016) 12 tablet 0   No current facility-administered medications for this visit.      Review of Systems A 10 point review of systems was asked and was negative except for the information on the HPI  Physical Exam Blood pressure 135/79, pulse 82, temperature 98.3 F (36.8 C), temperature source Oral, height 5\' 7"  (1.702 m), weight (!) 141.1 kg (311 lb), last menstrual period 10/19/2015. CONSTITUTIONAL: Very nice morbidly obese female in no acute distress EYES: Pupils are equal, round, and reactive to light, Sclera are non-icteric. EARS, NOSE, MOUTH AND THROAT: The oropharynx is clear. The oral mucosa is pink and moist. Hearing is intact to voice. LYMPH NODES:  Lymph nodes in the neck are normal. RESPIRATORY:  Lungs are clear. There is normal respiratory effort, with equal breath sounds bilaterally, and without pathologic use of accessory muscles. CARDIOVASCULAR: Heart is regular without murmurs, gallops, or rubs. GI: The abdomen is soft, mildly tender to palpation in the right upper quadrant GU: Rectal deferred.   MUSCULOSKELETAL: Normal muscle strength and tone. No  cyanosis or edema.   SKIN: Turgor is good and there are no pathologic skin lesions or ulcers. NEUROLOGIC: Motor and sensation is grossly normal. Cranial nerves are grossly intact. PSYCH:  Oriented to person, place and time. Affect is normal.  Data Reviewed I have personally reviewed the patient's imaging, laboratory findings and medical records.    Assessment Plan Symptomatic cholelithiasis, discussed with the patient in detail about her disease process. I  do recommend laparoscopic cholecystectomy with intraoperative cholangiogram given the fact that there is a slight increase in her common bile duct. This is likely results for some inflammatory process and not necessarily being obstruction. More importantly clinically there is no evidence of biliary obstruction and there is really no laboratory evidence of obstruction either. I discussed the procedure in detail.  The patient was given Agricultural engineer.  We discussed the risks and benefits of a laparoscopic cholecystectomy and possible cholangiogram including, but not limited to bleeding, infection, injury to surrounding structures such as the intestine or liver, bile leak, retained gallstones, need to convert to an open procedure, prolonged diarrhea, blood clots such as  DVT, common bile duct injury, anesthesia risks, and possible need for additional procedures.  The likelihood of improvement in symptoms and return to the patient's normal status is good. We discussed the typical post-operative recovery course I specifically discussed with her that given her morbid obesity there might be complications related to her airway and anesthesia complications. I also discussed with her that on the ultrasound I see a fatty liver . Weight Reduction definitely recommended. Extensive counseling provided, all questions were answered  Sterling Big, MD FACS General Surgeon 01/21/2016, 11:52 AM

## 2016-02-02 NOTE — Transfer of Care (Signed)
Immediate Anesthesia Transfer of Care Note  Patient: Diana Aguirre  Procedure(s) Performed: Procedure(s): LAPAROSCOPIC CHOLECYSTECTOMY  Patient Location: PACU  Anesthesia Type:General  Level of Consciousness: sedated  Airway & Oxygen Therapy: Patient Spontanous Breathing and Patient connected to face mask oxygen  Post-op Assessment: Report given to RN and Post -op Vital signs reviewed and stable  Post vital signs: Reviewed and stable  Last Vitals:  Vitals:   02/02/16 1045  BP: (!) 134/93  Pulse: (!) 109  Resp: 20  Temp: 37.3 C    Last Pain:  Vitals:   02/02/16 1045  TempSrc: Tympanic         Complications: No apparent anesthesia complications

## 2016-02-02 NOTE — Anesthesia Procedure Notes (Signed)
Procedure Name: Intubation Date/Time: 02/02/2016 12:53 PM Performed by: Junious SilkNOLES, Orris Perin Pre-anesthesia Checklist: Patient identified, Patient being monitored, Timeout performed, Emergency Drugs available and Suction available Patient Re-evaluated:Patient Re-evaluated prior to inductionOxygen Delivery Method: Circle system utilized Preoxygenation: Pre-oxygenation with 100% oxygen Intubation Type: IV induction Ventilation: Mask ventilation without difficulty Laryngoscope Size: 3 and McGraph Grade View: Grade II Tube type: Oral Tube size: 7.0 mm Number of attempts: 1 Airway Equipment and Method: Stylet and Video-laryngoscopy Placement Confirmation: ETT inserted through vocal cords under direct vision,  positive ETCO2 and breath sounds checked- equal and bilateral Secured at: 21 cm Tube secured with: Tape Dental Injury: Teeth and Oropharynx as per pre-operative assessment  Difficulty Due To: Difficulty was anticipated and Difficult Airway-  due to edematous airway

## 2016-02-02 NOTE — Discharge Instructions (Signed)

## 2016-02-02 NOTE — Anesthesia Preprocedure Evaluation (Signed)
Anesthesia Evaluation  Patient identified by MRN, date of birth, ID band Patient awake    Reviewed: Allergy & Precautions, H&P , NPO status , Patient's Chart, lab work & pertinent test results, reviewed documented beta blocker date and time   Airway Mallampati: II  TM Distance: >3 FB Neck ROM: full    Dental  (+) Teeth Intact   Pulmonary neg pulmonary ROS, asthma ,    Pulmonary exam normal        Cardiovascular negative cardio ROS Normal cardiovascular exam Rhythm:regular Rate:Normal     Neuro/Psych negative neurological ROS  negative psych ROS   GI/Hepatic negative GI ROS, Neg liver ROS, hiatal hernia, Medicated,  Endo/Other  negative endocrine ROSMorbid obesity  Renal/GU negative Renal ROS  negative genitourinary   Musculoskeletal   Abdominal   Peds  Hematology negative hematology ROS (+) anemia ,   Anesthesia Other Findings Past Medical History: No date: Anemia     Comment: H/O IN JAN 2016 No date: Asthma     Comment: WELL CONTROLLED No date: Hidradenitis 01/17/2016: History of hiatal hernia     Comment: SMALL PER CT SCAN Past Surgical History: No date: TONSILLECTOMY     Comment: 1999 BMI    Body Mass Index:  48.71 kg/m     Reproductive/Obstetrics negative OB ROS                             Anesthesia Physical Anesthesia Plan  ASA: III  Anesthesia Plan: General ETT   Post-op Pain Management:    Induction:   Airway Management Planned:   Additional Equipment:   Intra-op Plan:   Post-operative Plan:   Informed Consent: I have reviewed the patients History and Physical, chart, labs and discussed the procedure including the risks, benefits and alternatives for the proposed anesthesia with the patient or authorized representative who has indicated his/her understanding and acceptance.   Dental Advisory Given  Plan Discussed with: CRNA  Anesthesia Plan Comments:          Anesthesia Quick Evaluation

## 2016-02-02 NOTE — Op Note (Signed)
Laparoscopic Cholecystectomy  Pre-operative Diagnosis: Symptomatic cholelithiasis  Post-operative Diagnosis: Same  Procedure: Laparoscopic cholecystectomy  Surgeon: Sterling Bigiego Mathilde Mcwherter, MD FACS  Anesthesia: Gen. with endotracheal tube   Findings: Gallstone  Unable to do a cholangiogram due to the very small size of the cystic duct  Estimated Blood Loss: 10cc                 Specimens: Gallbladder           Complications: none   Procedure Details  The patient was seen again in the Holding Room. The benefits, complications, treatment options, and expected outcomes were discussed with the patient. The risks of bleeding, infection, recurrence of symptoms, failure to resolve symptoms, bile duct damage, bile duct leak, retained common bile duct stone, bowel injury, any of which could require further surgery and/or ERCP, stent, or papillotomy were reviewed with the patient. The likelihood of improving the patient's symptoms with return to their baseline status is good.  The patient and/or family concurred with the proposed plan, giving informed consent.  The patient was taken to Operating Room, identified as Tama HighSarah Munger and the procedure verified as Laparoscopic Cholecystectomy.  A Time Out was held and the above information confirmed.  Prior to the induction of general anesthesia, antibiotic prophylaxis was administered. VTE prophylaxis was in place. General endotracheal anesthesia was then administered and tolerated well. After the induction, the abdomen was prepped with Chloraprep and draped in the sterile fashion. The patient was positioned in the supine position.  Local anesthetic  was injected into the skin near the umbilicus and an incision made. Cut down technique was used to enter the abdominal cavity and a Hasson trochar was placed after two vicryl stitches were anchored to the fascia. Pneumoperitoneum was then created with CO2 and tolerated well without any adverse changes in the  patient's vital signs.  Three 5-mm ports were placed in the right upper quadrant all under direct vision. All skin incisions  were infiltrated with a local anesthetic agent before making the incision and placing the trocars.   The patient was positioned  in reverse Trendelenburg, tilted slightly to the patient's left.  The gallbladder was identified, the fundus grasped and retracted cephalad. Adhesions were lysed bluntly. The infundibulum was grasped and retracted laterally, exposing the peritoneum overlying the triangle of Calot. This was then divided and exposed in a blunt fashion. An extended critical view of the cystic duct and cystic artery was obtained.  The cystic duct was clearly identified and bluntly dissected. Cystic duct was very small and I did a ductotomy and attempted to perform a cholangiogram however the we'll diameter of the cystic duct was very small and is smaller than the cholangiogram catheter. There was no evidence of a retained stone or any abnormality with the biliary system intraoperatively The  Artery and duct were double clipped and divided.  The gallbladder was taken from the gallbladder fossa in a retrograde fashion with the electrocautery. The gallbladder was removed and placed in an Endocatch bag. The liver bed was irrigated and inspected. Hemostasis was achieved with the electrocautery. Copious irrigation was utilized and was repeatedly aspirated until clear.  The gallbladder and Endocatch sac were then removed through the epigastric port site.   Inspection of the right upper quadrant was performed. No bleeding, bile duct injury or leak, or bowel injury was noted. Pneumoperitoneum was released.  The periumbilical port site was closed with figure-of-eight 0 Vicryl sutures. 4-0 subcuticular Monocryl was used to close the skin.  Dermabond was  applied.  The patient was then extubated and brought to the recovery room in stable condition. Sponge, lap, and needle counts were correct  at closure and at the conclusion of the case.               Sterling Big, MD, FACS

## 2016-02-02 NOTE — Interval H&P Note (Signed)
History and Physical Interval Note:  02/02/2016 12:11 PM  Diana HighSarah Aguirre  has presented today for surgery, with the diagnosis of gallstones  The various methods of treatment have been discussed with the patient and family. After consideration of risks, benefits and other options for treatment, the patient has consented to  Procedure(s): LAPAROSCOPIC CHOLECYSTECTOMY with cholangiogram (N/A) as a surgical intervention .  The patient's history has been reviewed, patient examined, no change in status, stable for surgery.  I have reviewed the patient's chart and labs.  Questions were answered to the patient's satisfaction.     Gotham Raden F Dea Bitting

## 2016-02-03 ENCOUNTER — Encounter: Payer: Self-pay | Admitting: Surgery

## 2016-02-04 LAB — SURGICAL PATHOLOGY

## 2016-02-11 ENCOUNTER — Ambulatory Visit (INDEPENDENT_AMBULATORY_CARE_PROVIDER_SITE_OTHER): Payer: BLUE CROSS/BLUE SHIELD | Admitting: Surgery

## 2016-02-11 ENCOUNTER — Other Ambulatory Visit: Payer: Self-pay

## 2016-02-11 ENCOUNTER — Encounter: Payer: Self-pay | Admitting: Surgery

## 2016-02-11 VITALS — BP 137/86 | HR 92 | Temp 98.3°F | Ht 67.0 in | Wt 302.0 lb

## 2016-02-11 DIAGNOSIS — Z09 Encounter for follow-up examination after completed treatment for conditions other than malignant neoplasm: Secondary | ICD-10-CM

## 2016-02-11 NOTE — Patient Instructions (Signed)

## 2016-02-12 ENCOUNTER — Encounter: Payer: BLUE CROSS/BLUE SHIELD | Admitting: Surgery

## 2016-02-12 NOTE — Progress Notes (Signed)
S/p lap chole 9/26 Doing very well A bit sore Path d/w pt in detail  PE NAD Abd: incisions c/d/i.   A/P doing well NO surgical complications No heavy lifting RTC prn

## 2016-02-18 ENCOUNTER — Ambulatory Visit: Payer: Self-pay | Admitting: Gastroenterology

## 2016-04-29 ENCOUNTER — Telehealth: Payer: Self-pay | Admitting: Surgery

## 2016-04-29 MED ORDER — CHOLESTYRAMINE 4 G PO PACK: 4.0000 g | PACK | Freq: Three times a day (TID) | ORAL | 12 refills | Status: DC

## 2016-04-29 NOTE — Telephone Encounter (Signed)
Spoke with patient at this time. She has changed her diet to all low-fat foods and despite doing this, she continues to have diarrhea after eating almost any food that she tries to eat. Denies any other symptoms. Spoke with Dr. Orvis BrillLoflin in regards to this. She has ordered Cholestyramine 4g TID with meals.  Medication has been sent to preferred pharmacy.  Patient was given instructions for use of medication and asked to call next Tuesday if this is not helping at all so that we can have her seen by her Surgeon. She verbalizes understanding of this.

## 2016-04-29 NOTE — Telephone Encounter (Signed)
Since patients gallbladder surgery in Sept. She is having a problem finding food that she can eat. She would like for you refer her to a nutritionist. Please call

## 2016-04-29 NOTE — Telephone Encounter (Signed)
Returned phone call to patient at this time. No answer. Left voicemail for patient to return phone call for further details about eating post-cholecystectomy.

## 2017-05-19 ENCOUNTER — Other Ambulatory Visit: Payer: Self-pay

## 2017-05-19 ENCOUNTER — Ambulatory Visit
Admission: EM | Admit: 2017-05-19 | Discharge: 2017-05-19 | Disposition: A | Discharge status: Home / Self Care | Payer: BLUE CROSS/BLUE SHIELD | Attending: Family Medicine | Admitting: Family Medicine

## 2017-05-19 ENCOUNTER — Encounter: Payer: Self-pay | Admitting: Emergency Medicine

## 2017-05-19 DIAGNOSIS — J029 Acute pharyngitis, unspecified: Secondary | ICD-10-CM | POA: Diagnosis not present

## 2017-05-19 DIAGNOSIS — J020 Streptococcal pharyngitis: Secondary | ICD-10-CM | POA: Diagnosis not present

## 2017-05-19 DIAGNOSIS — R110 Nausea: Secondary | ICD-10-CM | POA: Diagnosis not present

## 2017-05-19 LAB — RAPID STREP SCREEN (MED CTR MEBANE ONLY): Streptococcus, Group A Screen (Direct): POSITIVE — AB

## 2017-05-19 MED ORDER — AZITHROMYCIN 500 MG PO TABS: 500.0000 mg | ORAL_TABLET | Freq: Every day | ORAL | 0 refills | Status: DC

## 2017-05-19 MED ORDER — ONDANSETRON 8 MG PO TBDP: 8.0000 mg | ORAL_TABLET | Freq: Two times a day (BID) | ORAL | 0 refills | Status: DC

## 2017-05-19 NOTE — ED Provider Notes (Signed)
[facility]    CSN: [id number] Arrival date & time: 05/19/17  1820     History   Chief Complaint Chief Complaint  Patient presents with  . Nausea    APPT  . Sore Throat       HPI Diana Aguirre is a [age] y.o. female.   HPI  27 year old female presents with 4 days history of nausea and sore throat.  Not had any fever or chills.  But has had nausea no vomiting.  Not had strep throat in the past.  Had no coughing or nasal congestion.      Past Medical History:  Diagnosis Date  . Anemia    H/O IN JAN 2016  . Asthma    WELL CONTROLLED  . Hidradenitis   . History of hiatal hernia 01/17/2016   SMALL PER CT SCAN    Patient Active Problem List   Diagnosis Date Noted  . Cholelithiasis with cholecystitis of other acuity   . Acne vulgaris 10/01/2014  . Hidradenitis 10/01/2014    Past Surgical History:  Procedure Laterality Date  . CHOLECYSTECTOMY  02/02/2016   Procedure: LAPAROSCOPIC CHOLECYSTECTOMY;  Surgeon: Leafy Roiego F Pabon, MD;  Location: [facility] ORS;  Service: General;;  . TONSILLECTOMY     1999    OB History    No data available       Home Medications    Prior to Admission medications   Medication Sig Start Date End Date Taking? Authorizing Provider  albuterol (PROVENTIL HFA;VENTOLIN HFA) 108 (90 Base) MCG/ACT inhaler Inhale 2 puffs into the lungs every 6 (six) hours as needed for wheezing or shortness of breath.   Yes [provider]  buPROPion (WELLBUTRIN XL) 150 MG 24 hr tablet Take 150 mg by mouth every morning.  04/03/15 05/19/17 Yes [provider]  drospirenone-ethinyl estradiol (YAZ,GIANVI,LORYNA) 3-0.02 MG tablet Take by mouth. 10/06/15 05/19/17 Yes [provider]  escitalopram (LEXAPRO) 20 MG tablet Take 20 mg by mouth at bedtime.  11/24/15 05/19/17 Yes [provider]  spironolactone (ALDACTONE) 100 MG tablet Take 100 mg by mouth daily. PT TAKES THIS FOR A SKIN CONDITION 10/06/15 05/19/17 Yes [provider]  azithromycin (ZITHROMAX) 500 MG tablet Take 1 tablet (500 mg total) by mouth daily. 05/19/17   Lutricia Feiloemer, William P, PA-C  ondansetron (ZOFRAN ODT) 8 MG disintegrating tablet Take 1 tablet (8 mg total) by mouth 2 (two) times daily. 05/19/17   Lutricia Feiloemer, William P, PA-C    Family History Family History  Problem Relation Age of Onset  . Lupus Mother   . Heart disease Maternal Grandfather   . Hypertension Father     Social History Social History   Tobacco Use  . Smoking status: Never Smoker  . Smokeless tobacco: Never Used  Substance Use Topics  . Alcohol use: Yes    Comment: RARE  . Drug use: No     Allergies   2,4-d dimethylamine (amisol); Amoxicillin-pot clavulanate; Sulfa antibiotics; and Latex   Review of Systems Review of Systems  Constitutional: Positive for activity change. Negative for chills, fatigue and fever.  HENT: Positive for sore throat.   Respiratory: Negative for cough.   Gastrointestinal: Positive for nausea. Negative for diarrhea and vomiting.  All other systems reviewed and are negative.    Physical Exam Triage Vital Signs ED Triage Vitals  Enc Vitals Group     BP 05/19/17 1846 (!) 151/88     Pulse Rate 05/19/17 1846 92     Resp 05/19/17  1846 16     Temp 05/19/17 1846 98.6 F (37 C)     Temp Source 05/19/17 1846 Oral     SpO2 05/19/17 1846 98 %     Weight 05/19/17 1845 280 lb (127 kg)     Height 05/19/17 1845 5\' 7"  (1.702 m)     Head Circumference --      Peak Flow --      Pain Score 05/19/17 1846 2     Pain Loc --      Pain Edu? --      Excl. in GC? --    No data found.  Updated Vital Signs BP (!) 151/88 (BP Location: Left Arm)   Pulse 92   Temp 98.6 F (37 C) (Oral)   Resp 16   Ht 5\' 7"  (1.702 m)   Wt 280 lb (127 kg)   LMP 05/01/2017 (Exact Date)   SpO2 98%   BMI 43.85 kg/m   Visual Acuity Right Eye Distance:   Left Eye Distance:   Bilateral Distance:    Right Eye Near:   Left Eye Near:    Bilateral Near:       Physical Exam  Constitutional: She is oriented to person, place, and time. She appears well-developed and well-nourished.  Non-toxic appearance. She does not appear ill. No distress.  HENT:  Head: Normocephalic.  Right Ear: Hearing, tympanic membrane and ear canal normal.  Left Ear: Hearing, tympanic membrane and ear canal normal.  Mouth/Throat: Mucous membranes are normal. No oral lesions. No uvula swelling. Posterior oropharyngeal erythema present. No oropharyngeal exudate, posterior oropharyngeal edema or tonsillar abscesses. No tonsillar exudate.  Eyes: Pupils are equal, round, and reactive to light.  Neck: Normal range of motion.  Pulmonary/Chest: Effort normal and breath sounds normal.  Lymphadenopathy:    She has cervical adenopathy.  Neurological: She is alert and oriented to person, place, and time.  Skin: Skin is warm and dry.  Psychiatric: She has a normal mood and affect. Her behavior is normal.  Nursing note and vitals reviewed.    UC Treatments / Results  Labs (all labs ordered are listed, but only abnormal results are displayed) Labs Reviewed  RAPID STREP SCREEN (NOT AT [facility]) - Abnormal; Notable for the following components:      Result Value   Streptococcus, Group A Screen (Direct) POSITIVE (*)    All other components within normal limits    EKG  EKG Interpretation None       Radiology No results found.  Procedures Procedures (including critical care time)  Medications Ordered in UC Medications - No data to display   Initial Impression / Assessment and Plan / UC Course  I have reviewed the triage vital signs and the nursing notes.  Pertinent labs & imaging results that were available during my care of the patient were reviewed by me and considered in my medical decision making (see chart for details).     Plan: 1. Test/x-ray results and diagnosis reviewed with patient 2. rx as per orders; risks, benefits, potential side effects reviewed with  patient 3. Recommend supportive treatment with lozenges or salt water gargles.  Is allergic to amoxicillin so we will treat her with azithromycin 500 mg daily for 5 days.  Use to have symptoms she should follow-up with her primary care physician.  And her Zofran for nausea recommend ibuprofen for throat irritation 4. F/u prn if symptoms worsen or don't improve   Final Clinical Impressions(s) / UC Diagnoses  Final diagnoses:  Strep pharyngitis    ED Discharge Orders        Ordered    azithromycin (ZITHROMAX) 500 MG tablet  Daily     05/19/17 1917    ondansetron (ZOFRAN ODT) 8 MG disintegrating tablet  2 times daily     05/19/17 1925       Controlled Substance Prescriptions [place] Controlled Substance Registry consulted? Not Applicable   Lutricia Feil, PA-C 05/19/17 1930

## 2017-05-19 NOTE — Discharge Instructions (Signed)
Salt Water gargles with half teaspoon of salt in 8 ounces of water often as necessary.  Use lozenges or cough drops as necessary.

## 2017-05-19 NOTE — ED Triage Notes (Signed)
Patient in tonight c/o 4 days of nausea and sore throat. Patient denies fever. Patient has been using Tylenol and Ibuprofen.

## 2017-05-22 ENCOUNTER — Telehealth: Payer: Self-pay | Admitting: Emergency Medicine

## 2017-05-22 NOTE — Telephone Encounter (Signed)
Called to follow up with patient re: her recent visit. Left message to call with any questions or concerns.

## 2017-09-03 ENCOUNTER — Ambulatory Visit
Admission: EM | Admit: 2017-09-03 | Discharge: 2017-09-03 | Disposition: A | Discharge status: Home / Self Care | Payer: BLUE CROSS/BLUE SHIELD | Attending: Family Medicine | Admitting: Family Medicine

## 2017-09-03 ENCOUNTER — Other Ambulatory Visit: Payer: Self-pay

## 2017-09-03 DIAGNOSIS — J45909 Unspecified asthma, uncomplicated: Secondary | ICD-10-CM | POA: Diagnosis not present

## 2017-09-03 DIAGNOSIS — Z760 Encounter for issue of repeat prescription: Secondary | ICD-10-CM | POA: Diagnosis not present

## 2017-09-03 HISTORY — DX: Other allergic rhinitis: J30.89

## 2017-09-03 MED ORDER — ALBUTEROL SULFATE HFA 108 (90 BASE) MCG/ACT IN AERS: 2.0000 | INHALATION_SPRAY | Freq: Four times a day (QID) | RESPIRATORY_TRACT | 3 refills | Status: AC | PRN

## 2017-09-03 NOTE — Discharge Instructions (Signed)
Albuterol as needed. ° °Take care ° °Dr. Jiovanny Burdell  °

## 2017-09-03 NOTE — ED Provider Notes (Signed)
[facility]  CSN: [id number] Arrival date & time: 09/03/17  1533  History   Chief Complaint Chief Complaint  Patient presents with  . Medication Refill   HPI  27 year old female presents for medication refill.  Patient states that she has had difficulty with her asthma recently.  She is feeling well currently.  However, she is currently out of her albuterol.  She has contacted her primary care but she cannot be seen for 3 weeks.  She is requesting a medication refill today.  She is feeling well currently.  No wheezing or shortness of breath.  No other complaints or concerns at this time.  Past Medical History:  Diagnosis Date  . Anemia    H/O IN JAN 2016  . Asthma    WELL CONTROLLED  . Environmental and seasonal allergies   . Hidradenitis   . History of hiatal hernia 01/17/2016   SMALL PER CT SCAN   Patient Active Problem List   Diagnosis Date Noted  . Cholelithiasis with cholecystitis of other acuity   . Acne vulgaris 10/01/2014  . Hidradenitis 10/01/2014   Past Surgical History:  Procedure Laterality Date  . CHOLECYSTECTOMY  02/02/2016   Procedure: LAPAROSCOPIC CHOLECYSTECTOMY;  Surgeon: Leafy Ro, MD;  Location: [facility] ORS;  Service: General;;  . TONSILLECTOMY     1999   OB History   None    Home Medications    Prior to Admission medications   Medication Sig Start Date End Date Taking? Authorizing Provider  albuterol (PROVENTIL HFA;VENTOLIN HFA) 108 (90 Base) MCG/ACT inhaler Inhale 2 puffs into the lungs every 6 (six) hours as needed for wheezing or shortness of breath. 09/03/17   Tommie Sams, DO  buPROPion (WELLBUTRIN XL) 150 MG 24 hr tablet Take 150 mg by mouth every morning.  04/03/15 05/19/17  [provider]  drospirenone-ethinyl estradiol (YAZ,GIANVI,LORYNA) 3-0.02 MG tablet Take by mouth. 10/06/15 05/19/17  [provider]  escitalopram (LEXAPRO) 20 MG tablet Take 20 mg by mouth at bedtime.  11/24/15 05/19/17  [provider]  spironolactone (ALDACTONE) 100 MG tablet Take 100 mg by mouth daily. PT TAKES THIS FOR A SKIN CONDITION 10/06/15 05/19/17  [provider]    Family History Family History  Problem Relation Age of Onset  . Lupus Mother   . Heart disease Maternal Grandfather   . Hypertension Father     Social History Social History   Tobacco Use  . Smoking status: Never Smoker  . Smokeless tobacco: Never Used  Substance Use Topics  . Alcohol use: Yes    Comment: RARE  . Drug use: No     Allergies   2,4-d dimethylamine (amisol); Amoxicillin-pot clavulanate; Sulfa antibiotics; and Latex   Review of Systems Review of Systems  Constitutional: Negative.   Respiratory: Negative.    Physical Exam Triage Vital Signs ED Triage Vitals [09/03/17 1542]  Enc Vitals Group     BP (!) 146/70     Pulse Rate 90     Resp 20     Temp 98.8 F (37.1 C)     Temp Source Oral     SpO2 99 %     Weight      Height      Head Circumference      Peak Flow      Pain Score 0     Pain Loc      Pain Edu?      Excl. in GC?    Updated  Vital Signs BP (!) 146/70 (BP Location: Left Arm)   Pulse 90   Temp 98.8 F (37.1 C) (Oral)   Resp 20   LMP 08/29/2017   SpO2 99%  Physical Exam  Constitutional: She is oriented to person, place, and time. She appears well-developed. No distress.  Cardiovascular: Normal rate and regular rhythm.  Pulmonary/Chest: Effort normal and breath sounds normal. She has no wheezes. She has no rales.  Neurological: She is alert and oriented to person, place, and time.  Psychiatric: She has a normal mood and affect. Her behavior is normal.  Nursing note and vitals reviewed.  UC Treatments / Results  Labs (all labs ordered are listed, but only abnormal results are displayed) Labs Reviewed - No data to display  EKG None Radiology No results found.  Procedures Procedures (including critical care time)  Medications Ordered in UC Medications - No data  to display   Initial Impression / Assessment and Plan / UC Course  I have reviewed the triage vital signs and the nursing notes.  Pertinent labs & imaging results that were available during my care of the patient were reviewed by me and considered in my medical decision making (see chart for details).     27 year old female presents for medication refill.  Albuterol refilled today.  Final Clinical Impressions(s) / UC Diagnoses   Final diagnoses:  Medication refill    ED Discharge Orders        Ordered    albuterol (PROVENTIL HFA;VENTOLIN HFA) 108 (90 Base) MCG/ACT inhaler  Every 6 hours PRN     09/03/17 1552     Controlled Substance Prescriptions [place] Controlled Substance Registry consulted? Not Applicable   Tommie Sams, DO 09/03/17 1611

## 2017-09-03 NOTE — ED Triage Notes (Signed)
Pt out of her inhaler and would like a refill.

## 2017-10-03 ENCOUNTER — Encounter: Payer: Self-pay | Admitting: Emergency Medicine

## 2017-10-03 ENCOUNTER — Other Ambulatory Visit: Payer: Self-pay

## 2017-10-03 ENCOUNTER — Ambulatory Visit
Admission: EM | Admit: 2017-10-03 | Discharge: 2017-10-03 | Disposition: A | Discharge status: Home / Self Care | Payer: BLUE CROSS/BLUE SHIELD | Attending: Nurse Practitioner | Admitting: Nurse Practitioner

## 2017-10-03 DIAGNOSIS — R1013 Epigastric pain: Secondary | ICD-10-CM | POA: Diagnosis not present

## 2017-10-03 MED ORDER — GI COCKTAIL ~~LOC~~
30.0000 mL | Freq: Once | ORAL | Status: AC
  Administered 2017-10-03: 30 mL via ORAL

## 2017-10-03 MED ORDER — ONDANSETRON 8 MG PO TBDP
8.0000 mg | ORAL_TABLET | Freq: Once | ORAL | Status: AC
  Administered 2017-10-03: 8 mg via ORAL

## 2017-10-03 MED ORDER — ONDANSETRON 8 MG PO TBDP: 8.0000 mg | ORAL_TABLET | Freq: Three times a day (TID) | ORAL | 0 refills | Status: AC | PRN

## 2017-10-03 MED ORDER — PANTOPRAZOLE SODIUM 20 MG PO TBEC: 20.0000 mg | DELAYED_RELEASE_TABLET | Freq: Every day | ORAL | 0 refills | Status: AC

## 2017-10-03 NOTE — Discharge Instructions (Addendum)
Take medications as prescribed.  Follow-up with your primary care provider in 1 week or sooner if needed.

## 2017-10-03 NOTE — ED Provider Notes (Signed)
[facility]    CSN: [id number] Arrival date & time: 10/03/17  1938     History   Chief Complaint Chief Complaint  Patient presents with  . Abdominal Pain    HPI Oluwatamilore Starnes is a 27 y.o. female.   Subjective:   Maely Clements is a 27 y.o. female who presents for evaluation of abdominal pain. The pain is described as burning and cramping, and is 6/10 in intensity. Pain is located in the epigastric without radiation. Onset was 1 week ago. Symptoms have been unchanged since. Aggravating factors: none.  Alleviating factors: none. Associated symptoms: nausea and vomiting. The patient denies anorexia, constipation, diarrhea, dysuria, fever, frequency, headache, hematochezia, hematuria, melena and sweats.  Has tried omeprazole and Tums without any relief in symptoms.  The following portions of the patient's history were reviewed and updated as appropriate: allergies, current medications, past family history, past medical history, past social history, past surgical history and problem list.       Past Medical History:  Diagnosis Date  . Anemia    H/O IN JAN 2016  . Asthma    WELL CONTROLLED  . Environmental and seasonal allergies   . Hidradenitis   . History of hiatal hernia 01/17/2016   SMALL PER CT SCAN    Patient Active Problem List   Diagnosis Date Noted  . Cholelithiasis with cholecystitis of other acuity   . Acne vulgaris 10/01/2014  . Hidradenitis 10/01/2014    Past Surgical History:  Procedure Laterality Date  . CHOLECYSTECTOMY  02/02/2016   Procedure: LAPAROSCOPIC CHOLECYSTECTOMY;  Surgeon: Leafy Ro, MD;  Location: [facility] ORS;  Service: General;;  . TONSILLECTOMY     1999    OB History   None      Home Medications    Prior to Admission medications   Medication Sig Start Date End Date Taking? Authorizing Provider  albuterol (PROVENTIL HFA;VENTOLIN HFA) 108 (90 Base) MCG/ACT inhaler Inhale 2 puffs into the lungs every 6 (six) hours as  needed for wheezing or shortness of breath. 09/03/17  Yes Cook, Jayce G, DO  buPROPion (WELLBUTRIN XL) 150 MG 24 hr tablet Take 150 mg by mouth every morning.  04/03/15 05/19/17  [provider]  drospirenone-ethinyl estradiol (YAZ,GIANVI,LORYNA) 3-0.02 MG tablet Take by mouth. 10/06/15 05/19/17  [provider]  escitalopram (LEXAPRO) 20 MG tablet Take 20 mg by mouth at bedtime.  11/24/15 05/19/17  [provider]  ondansetron (ZOFRAN ODT) 8 MG disintegrating tablet Take 1 tablet (8 mg total) by mouth every 8 (eight) hours as needed for nausea or vomiting. 10/03/17   Lurline Idol, FNP  pantoprazole (PROTONIX) 20 MG tablet Take 1 tablet (20 mg total) by mouth daily for 14 days. 10/03/17 10/17/17  Lurline Idol, FNP  spironolactone (ALDACTONE) 100 MG tablet Take 100 mg by mouth daily. PT TAKES THIS FOR A SKIN CONDITION 10/06/15 05/19/17  [provider]    Family History Family History  Problem Relation Age of Onset  . Lupus Mother   . Heart disease Maternal Grandfather   . Hypertension Father     Social History Social History   Tobacco Use  . Smoking status: Never Smoker  . Smokeless tobacco: Never Used  Substance Use Topics  . Alcohol use: Yes    Comment: RARE  . Drug use: No     Allergies   2,4-d dimethylamine (amisol); Amoxicillin-pot clavulanate; Sulfa antibiotics; and Latex   Review of Systems Review of Systems  Constitutional: Negative for fever.  Gastrointestinal: Positive for abdominal pain, nausea and vomiting. Negative for blood in stool, constipation, diarrhea and rectal pain.  Genitourinary: Negative for dysuria, flank pain, frequency, hematuria and urgency.  Musculoskeletal: Negative for back pain.  All other systems reviewed and are negative.    Physical Exam Triage Vital Signs ED Triage Vitals  Enc Vitals Group     BP 10/03/17 1947 136/84     Pulse Rate 10/03/17 1947 100     Resp 10/03/17 1947 18     Temp 10/03/17  1947 98.9 F (37.2 C)     Temp Source 10/03/17 1947 Oral     SpO2 10/03/17 1947 95 %     Weight 10/03/17 1950 290 lb (131.5 kg)     Height 10/03/17 1950  (1.727 m)     Head Circumference --      Peak Flow --      Pain Score 10/03/17 1947 3     Pain Loc --      Pain Edu? --      Excl. in GC? --    No data found.  Updated Vital Signs BP 136/84 (BP Location: Right Arm)   Pulse 100   Temp 98.9 F (37.2 C) (Oral)   Resp 18   Ht  (1.727 m)   Wt 290 lb (131.5 kg)   LMP 09/12/2017   SpO2 95%   BMI 44.09 kg/m   Visual Acuity Right Eye Distance:   Left Eye Distance:   Bilateral Distance:    Right Eye Near:   Left Eye Near:    Bilateral Near:     Physical Exam  Constitutional: She is oriented to person, place, and time. She appears well-developed and well-nourished.  Non-toxic appearance. She does not appear ill.  Cardiovascular: Normal rate.  Pulmonary/Chest: Effort normal and breath sounds normal.  Abdominal: Soft. Normal appearance and bowel sounds are normal. She exhibits no distension. There is tenderness in the epigastric area. There is no rebound, no guarding and no CVA tenderness.  Neurological: She is alert and oriented to person, place, and time.  Skin: Skin is warm and dry.  Psychiatric: She has a normal mood and affect.     UC Treatments / Results  Labs (all labs ordered are listed, but only abnormal results are displayed) Labs Reviewed - No data to display  EKG None  Radiology No results found.  Procedures Procedures (including critical care time)  Medications Ordered in UC Medications  gi cocktail (Maalox,Lidocaine,Donnatal) (30 mLs Oral Given 10/03/17 2018)  ondansetron (ZOFRAN-ODT) disintegrating tablet 8 mg (8 mg Oral Given 10/03/17 2018)    Initial Impression / Assessment and Plan / UC Course  I have reviewed the triage vital signs and the nursing notes.  Pertinent labs & imaging results that were available during my care of the  patient were reviewed by me and considered in my medical decision making (see chart for details).     27 year old female presenting with a one-week history of epigastric burning/cramping as well as intermittent nausea and occasional vomiting.  Patient is nontoxic-appearing.  Vital signs stable.  Afebrile.  Physical exam benign.  Plan: 1.  GI cocktail and Zofran ODT given in clinic 2.  Protonix 40 mg daily 3.  Dietary guidelines discussed  4.  Follow-up with PCP in 1 week for reevaluation or sooner if needed  Evaluation has revealed no signs of a dangerous process. Discussed diagnosis with patient. Patient aware of their illness, possible red flag symptoms to  watch out for and need for close follow up. Patient understands verbal and written discharge instructions. Patient comfortable with plan and disposition.  Patient a clear mental status at this time, good insight into illness (after discussion and teaching) and has clear judgment to make decisions regarding their care.  Documentation was completed with the aid of voice recognition software. Transcription may contain typographical errors.   Final Clinical Impressions(s) / UC Diagnoses   Final diagnoses:  Abdominal pain, epigastric     Discharge Instructions     Take medications as prescribed.  Follow-up with your primary care provider in 1 week or sooner if needed.    ED Prescriptions    Medication Sig Dispense Auth. Provider   pantoprazole (PROTONIX) 20 MG tablet Take 1 tablet (20 mg total) by mouth daily for 14 days. 14 tablet Maura Braaten, Lelon Mast, FNP   ondansetron (ZOFRAN ODT) 8 MG disintegrating tablet Take 1 tablet (8 mg total) by mouth every 8 (eight) hours as needed for nausea or vomiting. 20 tablet Lurline Idol, FNP     Controlled Substance Prescriptions [place] Controlled Substance Registry consulted? Not Applicable   Lurline Idol, [place] 10/03/17 2034

## 2017-10-03 NOTE — ED Triage Notes (Signed)
Abdominal pains mainly at night for 1 month. Patient stated feels like burning and a big cramp in abdomen.

## 2017-11-10 ENCOUNTER — Ambulatory Visit (INDEPENDENT_AMBULATORY_CARE_PROVIDER_SITE_OTHER): Payer: BLUE CROSS/BLUE SHIELD

## 2017-11-10 ENCOUNTER — Ambulatory Visit
Admission: EM | Admit: 2017-11-10 | Discharge: 2017-11-10 | Disposition: A | Discharge status: Home / Self Care | Payer: BLUE CROSS/BLUE SHIELD | Attending: Family Medicine | Admitting: Family Medicine

## 2017-11-10 DIAGNOSIS — S93402A Sprain of unspecified ligament of left ankle, initial encounter: Secondary | ICD-10-CM

## 2017-11-10 DIAGNOSIS — W19XXXA Unspecified fall, initial encounter: Secondary | ICD-10-CM | POA: Diagnosis not present

## 2017-11-10 DIAGNOSIS — S50311A Abrasion of right elbow, initial encounter: Secondary | ICD-10-CM | POA: Diagnosis not present

## 2017-11-10 DIAGNOSIS — M79672 Pain in left foot: Secondary | ICD-10-CM | POA: Diagnosis not present

## 2017-11-10 DIAGNOSIS — S99912A Unspecified injury of left ankle, initial encounter: Secondary | ICD-10-CM | POA: Diagnosis not present

## 2017-11-10 DIAGNOSIS — M25572 Pain in left ankle and joints of left foot: Secondary | ICD-10-CM

## 2017-11-10 MED ORDER — MELOXICAM 15 MG PO TABS: 15.0000 mg | ORAL_TABLET | Freq: Every day | ORAL | 0 refills | Status: AC | PRN

## 2017-11-10 NOTE — Discharge Instructions (Addendum)
Rest, ice, compression, elevation. ° °Medication as needed. ° °Take care ° °Dr. Monteen Toops  °

## 2017-11-10 NOTE — ED Triage Notes (Signed)
Pt fell in the work parking lot this morning and has a hx of a fractured left ankle. Now having ankle pain in her left ankle, it is swollen and hurts to place weight on it. No otc meds tried.

## 2017-11-10 NOTE — ED Provider Notes (Signed)
[facility]    CSN: [id number] Arrival date & time: 11/10/17  4098  History   Chief Complaint Chief Complaint  Patient presents with  . Fall   HPI  27 year old female presents with left ankle pain.  Patient states that she was walking to work this morning.  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  She was and not paying close attention and tripped over some mulch.  She states that she fell and suffered an abrasion on her right elbow and injured her left ankle.  She states that she twisted her left ankle.  She reports moderate pain currently.  She reports pain in the left ankle and left foot.  Associated swelling.  She states that she was unable to put back on her shoe due to swelling.  No medications or interventions tried.  No other associated symptoms.  No other complaints.  Past Medical History:  Diagnosis Date  . Anemia    H/O IN JAN 2016  . Asthma    WELL CONTROLLED  . Environmental and seasonal allergies   . Hidradenitis   . History of hiatal hernia 01/17/2016   SMALL PER CT SCAN   Patient Active Problem List   Diagnosis Date Noted  . Cholelithiasis with cholecystitis of other acuity   . Acne vulgaris 10/01/2014  . Hidradenitis 10/01/2014   Past Surgical History:  Procedure Laterality Date  . CHOLECYSTECTOMY  02/02/2016   Procedure: LAPAROSCOPIC CHOLECYSTECTOMY;  Surgeon: Leafy Ro, MD;  Location: [facility] ORS;  Service: General;;  . TONSILLECTOMY     1999   OB History   None    Home Medications    Prior to Admission medications   Medication Sig Start Date End Date Taking? Authorizing Provider  albuterol (PROVENTIL HFA;VENTOLIN HFA) 108 (90 Base) MCG/ACT inhaler Inhale 2 puffs into the lungs every 6 (six) hours as needed for wheezing or shortness of breath. 09/03/17  Yes Janna Oak G, DO  buPROPion (WELLBUTRIN XL) 150 MG 24 hr tablet Take 150 mg by mouth every morning.  04/03/15 11/10/17 Yes [provider]  drospirenone-ethinyl estradiol  (YAZ,GIANVI,LORYNA) 3-0.02 MG tablet Take by mouth. 10/06/15 11/10/17 Yes [provider]  escitalopram (LEXAPRO) 20 MG tablet Take 20 mg by mouth at bedtime.  11/24/15 11/10/17 Yes [provider]  ondansetron (ZOFRAN ODT) 8 MG disintegrating tablet Take 1 tablet (8 mg total) by mouth every 8 (eight) hours as needed for nausea or vomiting. 10/03/17  Yes Lurline Idol, FNP  spironolactone (ALDACTONE) 100 MG tablet Take 100 mg by mouth daily. PT TAKES THIS FOR A SKIN CONDITION 10/06/15 11/10/17 Yes [provider]  meloxicam (MOBIC) 15 MG tablet Take 1 tablet (15 mg total) by mouth daily as needed. 11/10/17   Tommie Sams, DO  pantoprazole (PROTONIX) 20 MG tablet Take 1 tablet (20 mg total) by mouth daily for 14 days. 10/03/17 10/17/17  Lurline Idol, FNP    Family History Family History  Problem Relation Age of Onset  . Lupus Mother   . Heart disease Maternal Grandfather   . Hypertension Father     Social History Social History   Tobacco Use  . Smoking status: Never Smoker  . Smokeless tobacco: Never Used  Substance Use Topics  . Alcohol use: Yes    Comment: RARE  . Drug use: No     Allergies   2,4-d dimethylamine (amisol); Amoxicillin-pot clavulanate; Sulfa antibiotics; and Latex   Review of Systems Review of Systems  Musculoskeletal:  Left ankle and foot pain.  Swelling.  Skin:       Abrasion.   Physical Exam Triage Vital Signs ED Triage Vitals  Enc Vitals Group     BP 11/10/17 0841 (!) 126/92     Pulse Rate 11/10/17 0841 100     Resp 11/10/17 0841 18     Temp 11/10/17 0841 98.8 F (37.1 C)     Temp Source 11/10/17 0841 Oral     SpO2 11/10/17 0841 99 %     Weight 11/10/17 0844 290 lb (131.5 kg)     Height --      Head Circumference --      Peak Flow --      Pain Score 11/10/17 0844 5     Pain Loc --      Pain Edu? --      Excl. in GC? --    Updated Vital Signs BP (!) 126/92 (BP Location: Left Arm)   Pulse 100   Temp 98.8 F  (37.1 C) (Oral)   Resp 18   Wt 290 lb (131.5 kg)   LMP 10/18/2017 Comment: denies preg  SpO2 99%   BMI 44.09 kg/m   Physical Exam  Constitutional: She is oriented to person, place, and time. She appears well-developed. No distress.  HENT:  Head: Normocephalic and atraumatic.  Pulmonary/Chest: Effort normal. No respiratory distress.  Musculoskeletal:  Left ankle and foot -inspection with swelling noted laterally.  Patient with tenderness over the lateral malleolus as well as the medial malleolus.  Decreased range of motion secondary to pain.  Patient with dorsal tenderness to palpation as well.  No tenderness at the fifth metatarsal base.  Neurological: She is alert and oriented to person, place, and time.  Skin:  Small abrasion noted to right elbow.  Psychiatric: She has a normal mood and affect. Her behavior is normal.  Nursing note and vitals reviewed.  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 Most pain/swelling left lat malleolus of ankle and pain top of foot near anterior ankle. hx fracture in ankle 2 years ago. EXAM: LEFT ANKLE COMPLETE - 3+ VIEW COMPARISON:  None. FINDINGS: There is no evidence of fracture, dislocation, or joint effusion. There is no evidence of arthropathy or other focal bone abnormality. Soft tissues are unremarkable. IMPRESSION: Negative. Electronically Signed   By: Corlis Leak  Hassell M.D.   On: 11/10/2017 09:13   Dg Foot Complete Left  Result Date: 11/10/2017 CLINICAL DATA:  Trip with injury. EXAM: LEFT FOOT - COMPLETE 3+ VIEW COMPARISON:  No prior. FINDINGS: Soft tissue structures are unremarkable. No acute bony or joint abnormality. No evidence of fracture. No evidence of dislocation. IMPRESSION: No acute  abnormality. Electronically Signed   By: Maisie Fushomas  Register   On: 11/10/2017 09:13    Procedures Procedures (including critical care time)  Medications Ordered in UC Medications - No data to display  Initial Impression / Assessment and Plan / UC Course  I have reviewed the triage vital signs and the nursing notes.  Pertinent labs & imaging results that were available during my care of the patient were reviewed by me and considered in my medical decision making (see chart for details).    27 year old female presents  with an ankle sprain.  X-rays negative.  Treating with rest, ice, compression, elevation.  Placed in [employer] today.  Meloxicam as needed.  Final Clinical Impressions(s) / UC Diagnoses   Final diagnoses:  Sprain of left ankle, unspecified ligament, initial encounter     Discharge Instructions     Rest, ice, compression, elevation.  Medication as needed.  Take care  Dr. Adriana Simas     ED Prescriptions    Medication Sig Dispense Auth. Provider   meloxicam (MOBIC) 15 MG tablet Take 1 tablet (15 mg total) by mouth daily as needed. 30 tablet Tommie Sams, DO     Controlled Substance Prescriptions [place] Controlled Substance Registry consulted? Not Applicable   Tommie Sams, [place] 11/10/17 1610

## 2020-06-21 IMAGING — CR DG FOOT COMPLETE 3+V*L*
3 series · 3 of 3 positions shown · non-contrast
Comparison: No prior.

CLINICAL DATA: Trip with injury.

EXAM:
LEFT FOOT - COMPLETE 3+ VIEW

[foot ap]
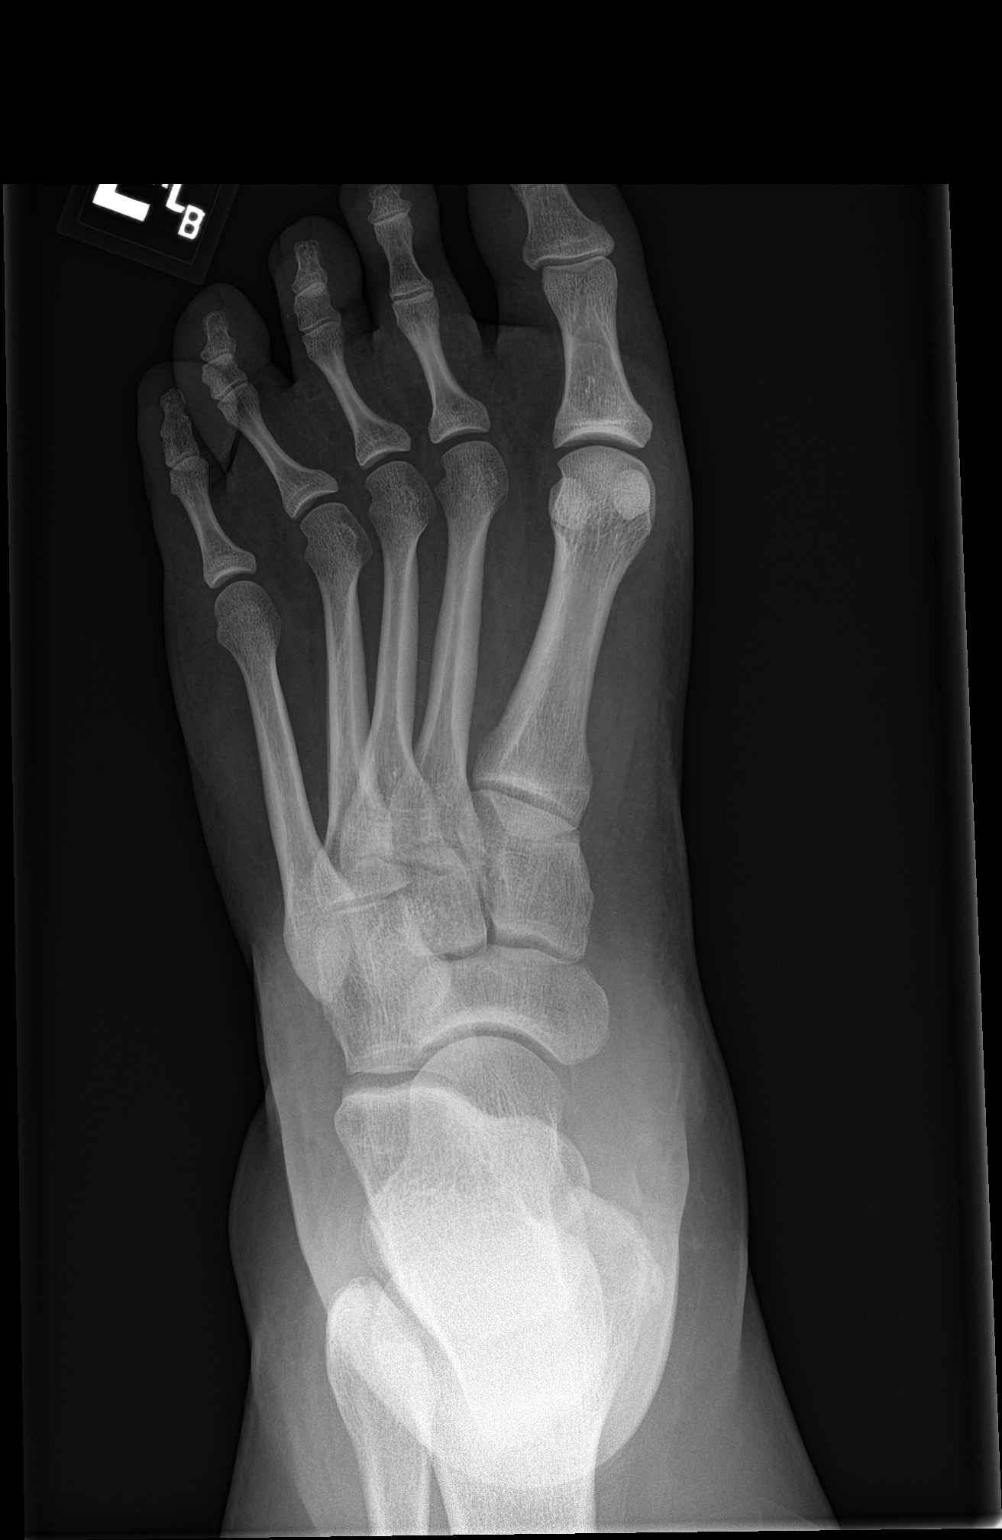

[foot obl]
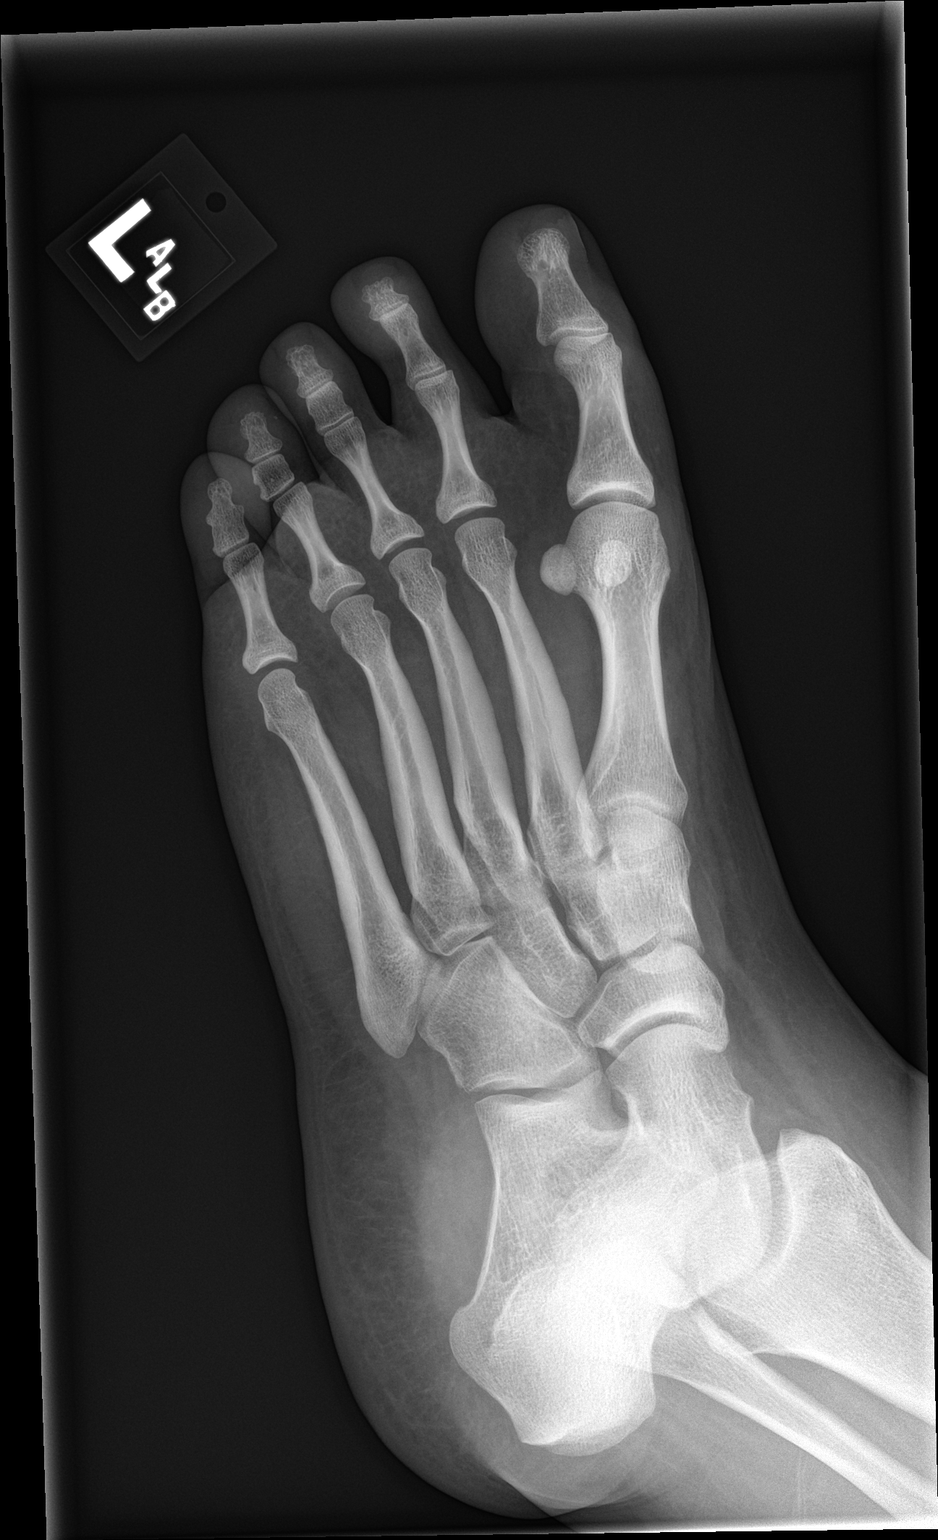

[foot lat]
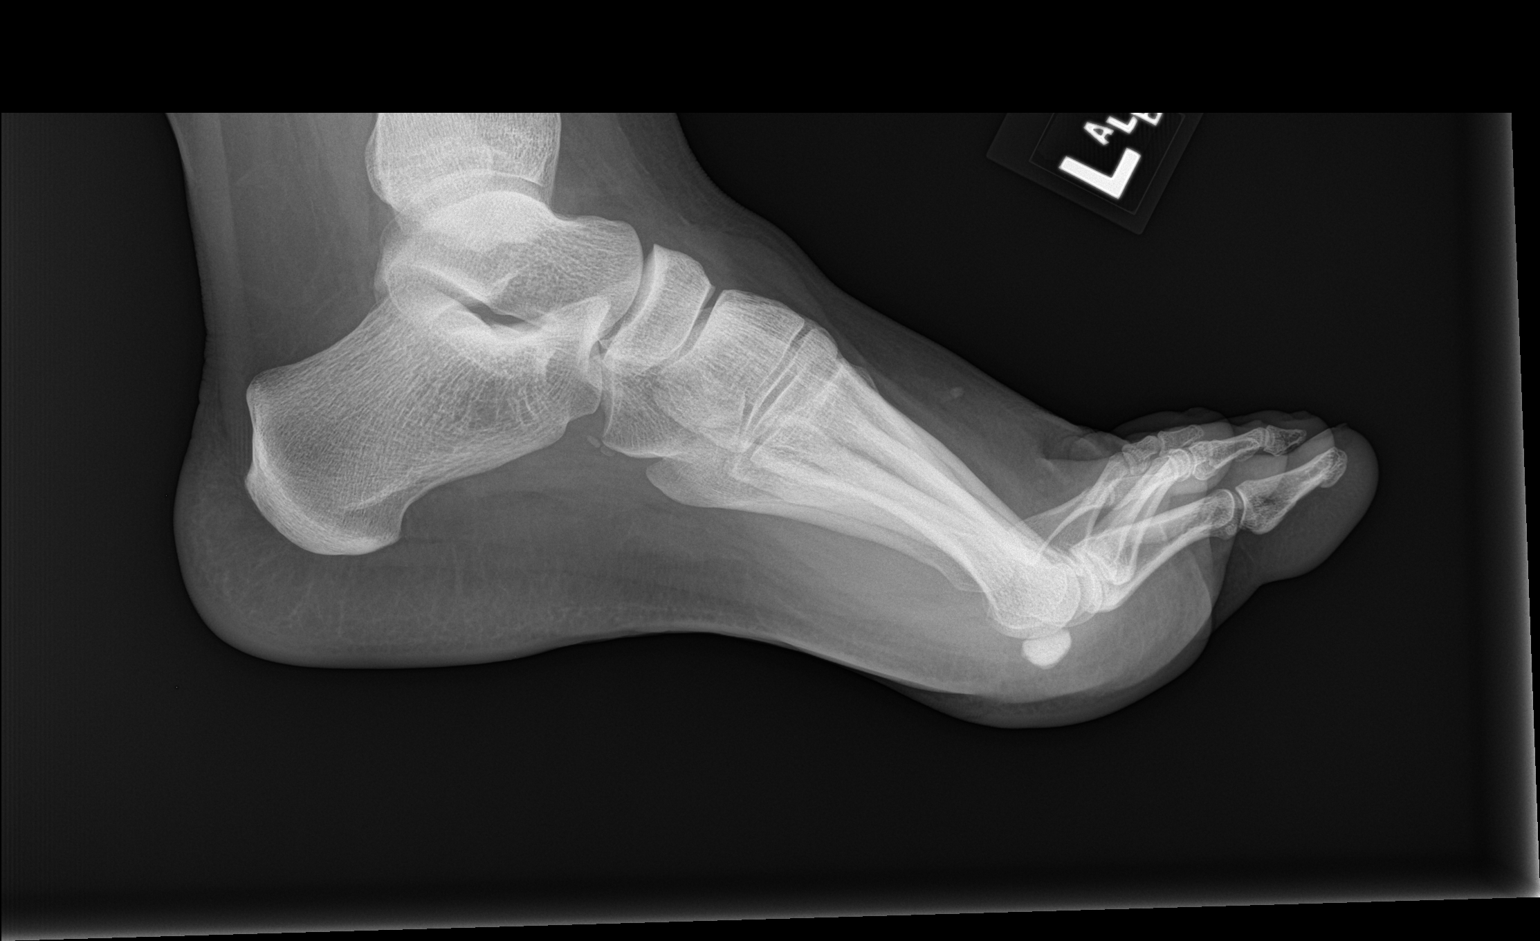

[3 of 3 positions shown; findings below may reference images not displayed]

FINDINGS: Soft tissue structures are unremarkable. No acute bony or joint
abnormality. No evidence of fracture. No evidence of dislocation.
IMPRESSION: No acute abnormality.
